# Patient Record
Sex: Male | Born: 1995 | Race: White | Hispanic: No | Marital: Single | Smoking: Never smoker
Health system: Southern US, Community
[De-identification: ages and names within clinical notes are randomized; demographics above are authoritative.]

## PROBLEM LIST (undated history)

## (undated) ENCOUNTER — Emergency Department (HOSPITAL_BASED_OUTPATIENT_CLINIC_OR_DEPARTMENT_OTHER): Admission: EM | Payer: BLUE CROSS/BLUE SHIELD | Source: Home / Self Care

## (undated) DIAGNOSIS — G500 Trigeminal neuralgia: Secondary | ICD-10-CM

## (undated) DIAGNOSIS — R634 Abnormal weight loss: Secondary | ICD-10-CM

## (undated) HISTORY — DX: Abnormal weight loss: R63.4

## (undated) HISTORY — DX: Trigeminal neuralgia: G50.0

## (undated) HISTORY — PX: ADENOIDECTOMY: SUR15

---
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
## 2016-01-01 NOTE — Telephone Encounter (Signed)
[facility], per Pt's chart he has first infusion scheduled 01/21/2016 at [facility].

## 2016-01-01 NOTE — Telephone Encounter (Addendum)
Caller name: Dotzler,Robert Relation to WU:JWJXBJpt:father  Call back number:[phone]   Reason for call:  Father calling back stating [facility] short stay is awaiting to here from the office and father would like a call informing him of status. Please advise

## 2016-01-03 NOTE — Telephone Encounter (Signed)
Spoke w/ Pt's father, Molly MaduroRobert, informed me that he spoke w/ Gerald[facility]rase[facility] at [facility], no further information needed.

## 2016-01-03 NOTE — Telephone Encounter (Signed)
Please follow up with patient father directly he would like to speak with the nurse directly regarding message below. Thank You

## 2016-01-08 DIAGNOSIS — K089 Disorder of teeth and supporting structures, unspecified: Secondary | ICD-10-CM | POA: Diagnosis not present

## 2016-01-08 DIAGNOSIS — G8929 Other chronic pain: Secondary | ICD-10-CM | POA: Diagnosis not present

## 2016-01-08 DIAGNOSIS — M792 Neuralgia and neuritis, unspecified: Secondary | ICD-10-CM | POA: Diagnosis not present

## 2016-01-09 ENCOUNTER — Ambulatory Visit (INDEPENDENT_AMBULATORY_CARE_PROVIDER_SITE_OTHER): Payer: BLUE CROSS/BLUE SHIELD | Admitting: *Deleted

## 2016-01-09 DIAGNOSIS — Z23 Encounter for immunization: Secondary | ICD-10-CM

## 2016-01-09 NOTE — Progress Notes (Signed)
Pre visit review using our clinic review tool, if applicable. No additional management support is needed unless otherwise documented below in the visit note.  Patient tolerated injection well.  Beecher Furio J Maizee Reinhold, RN 

## 2016-01-15 ENCOUNTER — Encounter (HOSPITAL_COMMUNITY)
Admission: RE | Admit: 2016-01-15 | Discharge: 2016-01-15 | Disposition: A | Discharge status: Home / Self Care | Payer: BLUE CROSS/BLUE SHIELD | Source: Ambulatory Visit | Attending: Internal Medicine | Admitting: Internal Medicine

## 2016-01-15 ENCOUNTER — Encounter (HOSPITAL_COMMUNITY): Payer: Self-pay

## 2016-01-15 DIAGNOSIS — G529 Cranial nerve disorder, unspecified: Secondary | ICD-10-CM | POA: Diagnosis not present

## 2016-01-15 DIAGNOSIS — R6884 Jaw pain: Secondary | ICD-10-CM | POA: Insufficient documentation

## 2016-01-15 MED ORDER — SODIUM CHLORIDE 0.9 % IV SOLN
500.0000 mg | Freq: Every day | INTRAVENOUS | Status: DC
  Administered 2016-01-15: 500 mg via INTRAVENOUS
  Filled 2016-01-15 (×2): qty 4

## 2016-01-15 MED ORDER — SODIUM CHLORIDE 0.9 % IV SOLN
Freq: Every day | INTRAVENOUS | Status: DC
  Administered 2016-01-15: 10:00:00 via INTRAVENOUS

## 2016-01-15 NOTE — Progress Notes (Signed)
Uneventful solumedrol infusion.  D/c paperwork on solumedrol given to pt.  Went over next appointments with pt for this week.  Pt d/c ambulatory with family member.

## 2016-01-15 NOTE — Discharge Instructions (Signed)
Methylprednisolone (Injection) (Injectable)   Methylprednisolone (By injection) Methylprednisolone (meth-il-pred-NIS-oh-lone) Treats inflammation, severe allergies, flare-ups of ongoing illnesses, and many other medical problems. May also be used to decrease some symptoms of cancer. This medicine is a corticosteroid (cortisone-like medicine or steroid).  Brand Name(s):Active Injection Kit L, Active Injection Kit LM-DEP, Active Injection Kit LM-Dep-1, Active Injection Kit LM-Dep-2, DEPO-Medrol, DEPO-Medrol Novaplus, Dyural-40, Dyural-80, Dyural-L, Dyural-LM, Medroloan II Suik, [employer], Multi-Specialty Kit, P-Care D40, P-Care D40G There may be other brand names for this medicine.  When This Medicine Should Not Be Used: This medicine is not right for everyone. You should not receive it if you had an allergic reaction to methylprednisolone, or if you have a fungus infection that affects your whole body. You should not have this medicine injected into a muscle if you have idiopathic thrombocytopenic purpura. Some strengths of Solu-Medrol that contain benzyl alcohol should not be used in premature babies. How to Use This Medicine:  A nurse or other trained health professional will give you this medicine. This medicine may be given through a needle placed in one of your veins, as a shot into a muscle or joint, or as a shot into a lesion on your skin.  A nurse or other health provider will give you this medicine.  Your doctor may give you a few doses of this medicine until your condition improves, and then switch you to an oral medicine that works the same way. If you have any concerns about this, talk to your doctor.   Missed dose:   You must use this medicine on a fixed schedule. Call your doctor or pharmacist if you miss a dose. Drugs and Foods to Avoid: Ask your doctor or pharmacist before using any other medicine, including over-the-counter medicines, vitamins, and herbal  products.  Some medicines can affect how this medicine works. Tell your doctor if you are using any of the following: ? Aminoglutethimide, amphotericin B, azithromycin, carbamazepine, cholestyramine, clarithromycin, cyclosporine, digoxin, erythromycin, isoniazid, ketoconazole, phenobarbital, phenytoin, rifampin, or troleandomycin ? Birth control pills ? Blood thinner (including warfarin) ? Diabetes medicine ? Diuretic (water pill) ? NSAID pain or arthritis medicine (including aspirin, celecoxib, diclofenac, ibuprofen, naproxen)  This medicine may interfere with vaccines. Ask your doctor before you get a flu shot or any other vaccines. Warnings While Using This Medicine:  Tell your doctor if you are pregnant or breastfeeding, or if you have kidney disease, liver disease, adrenal gland problems, cataracts, congestive heart failure, diabetes, glaucoma, high blood pressure, mental health problems, stomach or bowel problems (including ulcers, ulcerative colitis), myasthenia gravis, osteoporosis, thyroid problems, or a recent heart attack. Tell your doctor if you have an infection (including herpes eye infection, tuberculosis, or threadworm), or you have recently spent time in a tropical climate.  Solumedrol: Adverse Effects  Common  Cardiovascular: Hypertension  Dermatologic: Atrophic condition of skin, Impaired wound healing  Endocrine metabolic: Body fluid retention, Decreased body growth, Hypernatremia, Hypokalemia  Gastrointestinal: Disorder of gastrointestinal tract, Peptic ulcer disease  Hepatic: Liver function tests abnormal (Mild)  Immunologic: At risk for infection  Musculoskeletal: Muscle weakness  Psychiatric: Depression, Euphoria Serious  Cardiovascular: Congestive heart failure  Endocrine metabolic: Cushing's syndrome, Hyperglycemia, Primary adrenocortical insufficiency  Hepatic: Hepatotoxicity  Musculoskeletal: Osteoporosis  Neurologic: Cerebrovascular accident,  Infarction of spinal cord, Nerve injury, Paraplegia, Raised intracranial pressure, Seizure, Tetraplegia  Ophthalmic: Cataract, Cortical blindness, Glaucoma  Respiratory: Pulmonary tuberculosis

## 2016-01-16 ENCOUNTER — Encounter (HOSPITAL_COMMUNITY)
Admission: RE | Admit: 2016-01-16 | Discharge: 2016-01-16 | Disposition: A | Discharge status: Home / Self Care | Payer: BLUE CROSS/BLUE SHIELD | Source: Ambulatory Visit | Attending: Internal Medicine | Admitting: Internal Medicine

## 2016-01-16 ENCOUNTER — Encounter (HOSPITAL_COMMUNITY): Payer: Self-pay

## 2016-01-16 DIAGNOSIS — R6884 Jaw pain: Secondary | ICD-10-CM | POA: Diagnosis not present

## 2016-01-16 DIAGNOSIS — G529 Cranial nerve disorder, unspecified: Secondary | ICD-10-CM | POA: Diagnosis not present

## 2016-01-16 MED ORDER — SODIUM CHLORIDE 0.9 % IV SOLN
Freq: Every day | INTRAVENOUS | Status: DC
  Administered 2016-01-16: 09:00:00 via INTRAVENOUS

## 2016-01-16 MED ORDER — SODIUM CHLORIDE 0.9 % IV SOLN
500.0000 mg | Freq: Every day | INTRAVENOUS | Status: DC
  Administered 2016-01-16: 500 mg via INTRAVENOUS
  Filled 2016-01-16 (×2): qty 4

## 2016-01-16 NOTE — Discharge Instructions (Signed)
Methylprednisolone Solution for Injection  What is this medicine?  METHYLPREDNISOLONE (meth ill pred NISS oh lone) is a corticosteroid. It is commonly used to treat inflammation of the skin, joints, lungs, and other organs. Common conditions treated include asthma, allergies, and arthritis. It is also used for other conditions, such as blood disorders and diseases of the adrenal glands.  This medicine may be used for other purposes; ask your health care provider or pharmacist if you have questions.  What should I tell my health care provider before I take this medicine?  They need to know if you have any of these conditions:  -cataracts or glaucoma  -Cushing's syndrome  -heart disease  -high blood pressure  -infection including tuberculosis  -low calcium or potassium levels in the blood  -recent surgery  -seizures  -stomach or intestinal disease, including colitis  -threadworms  -thyroid problems  -an unusual or allergic reaction to methylprednisolone, corticosteroids, benzyl alcohol, other medicines, foods, dyes, or preservatives  -pregnant or trying to get pregnant  -breast-feeding  How should I use this medicine?  This medicine is for injection or infusion into a vein. It is also for injection into a muscle. It is given by a health care professional in a hospital or clinic setting.  Talk to your pediatrician regarding the use of this medicine in children. While this drug may be prescribed for selected conditions, precautions do apply.  Overdosage: If you think you have taken too much of this medicine contact a poison control center or emergency room at once.  NOTE: This medicine is only for you. Do not share this medicine with others.  What if I miss a dose?  This does not apply.  What may interact with this medicine?  Do not take this medicine with any of the following medications:  -mifepristone  This medicine may also interact with the following medications:  -aspirin and aspirin-like  medicines  -cyclosporin  -ketoconazole  -phenobarbital  -phenytoin  -rifampin  -tacrolimus  -troleandomycin  -vaccines  -warfarin  This list may not describe all possible interactions. Give your health care provider a list of all the medicines, herbs, non-prescription drugs, or dietary supplements you use. Also tell them if you smoke, drink alcohol, or use illegal drugs. Some items may interact with your medicine.  What should I watch for while using this medicine?  Visit your doctor or health care professional for regular checks on your progress. If you are taking this medicine for a long time, carry an identification card with your name and address, the type and dose of your medicine, and your doctor's name and address.  The medicine may increase your risk of getting an infection. Stay away from people who are sick. Tell your doctor or health care professional if you are around anyone with measles or chickenpox.  You may need to avoid some vaccines. Talk to your health care provider for more information.  If you are going to have surgery, tell your doctor or health care professional that you have taken this medicine within the last twelve months.  Ask your doctor or health care professional about your diet. You may need to lower the amount of salt you eat.  The medicine can increase your blood sugar. If you are a diabetic check with your doctor if you need help adjusting the dose of your diabetic medicine.  What side effects may I notice from receiving this medicine?  Side effects that you should report to your doctor or health care   professional as soon as possible:  -allergic reactions like skin rash, itching or hives, swelling of the face, lips, or tongue  -bloody or tarry stools  -changes in vision  -eye pain or bulging eyes  -fever, sore throat, sneezing, cough, or other signs of infection, wounds that will not heal  -increased thirst  -irregular heartbeat  -muscle cramps  -pain in hips, back, ribs, arms,  shoulders, or legs  -swelling of the ankles, feet, hands  -trouble passing urine or change in the amount of urine  -unusual bleeding or bruising  -unusually weak or tired  -weight gain or weight loss  Side effects that usually do not require medical attention (report to your doctor or health care professional if they continue or are bothersome):  -changes in emotions or moods  -constipation or diarrhea  -headache  -irritation at site where injected  -nausea, vomiting  -skin problems, acne, thin and shiny skin  -trouble sleeping  -unusual hair growth on the face or body  This list may not describe all possible side effects. Call your doctor for medical advice about side effects. You may report side effects to FDA at 1-800-FDA-1088.  Where should I keep my medicine?  This drug is given in a hospital or clinic and will not be stored at home.  NOTE: This sheet is a summary. It may not cover all possible information. If you have questions about this medicine, talk to your doctor, pharmacist, or health care provider.      2016, Elsevier/Gold Standard. (2012-02-11 11:37:16)

## 2016-01-17 ENCOUNTER — Encounter (HOSPITAL_COMMUNITY): Payer: Self-pay

## 2016-01-17 ENCOUNTER — Encounter (HOSPITAL_COMMUNITY)
Admission: RE | Admit: 2016-01-17 | Discharge: 2016-01-17 | Disposition: A | Discharge status: Home / Self Care | Payer: BLUE CROSS/BLUE SHIELD | Source: Ambulatory Visit | Attending: Internal Medicine | Admitting: Internal Medicine

## 2016-01-17 DIAGNOSIS — R6884 Jaw pain: Secondary | ICD-10-CM | POA: Diagnosis not present

## 2016-01-17 DIAGNOSIS — G529 Cranial nerve disorder, unspecified: Secondary | ICD-10-CM | POA: Diagnosis not present

## 2016-01-17 MED ORDER — SODIUM CHLORIDE 0.9 % IV SOLN
500.0000 mg | Freq: Every day | INTRAVENOUS | Status: DC
  Administered 2016-01-17: 500 mg via INTRAVENOUS
  Filled 2016-01-17 (×2): qty 4

## 2016-01-17 MED ORDER — SODIUM CHLORIDE 0.9 % IV SOLN
Freq: Every day | INTRAVENOUS | Status: DC
  Administered 2016-01-17: 08:00:00 via INTRAVENOUS

## 2016-01-17 NOTE — Discharge Instructions (Signed)
Methylprednisolone Solution for Injection  What is this medicine?  METHYLPREDNISOLONE (meth ill pred NISS oh lone) is a corticosteroid. It is commonly used to treat inflammation of the skin, joints, lungs, and other organs. Common conditions treated include asthma, allergies, and arthritis. It is also used for other conditions, such as blood disorders and diseases of the adrenal glands.  This medicine may be used for other purposes; ask your health care provider or pharmacist if you have questions.  What should I tell my health care provider before I take this medicine?  They need to know if you have any of these conditions:  -cataracts or glaucoma  -Cushing's syndrome  -heart disease  -high blood pressure  -infection including tuberculosis  -low calcium or potassium levels in the blood  -recent surgery  -seizures  -stomach or intestinal disease, including colitis  -threadworms  -thyroid problems  -an unusual or allergic reaction to methylprednisolone, corticosteroids, benzyl alcohol, other medicines, foods, dyes, or preservatives  -pregnant or trying to get pregnant  -breast-feeding  How should I use this medicine?  This medicine is for injection or infusion into a vein. It is also for injection into a muscle. It is given by a health care professional in a hospital or clinic setting.  Talk to your pediatrician regarding the use of this medicine in children. While this drug may be prescribed for selected conditions, precautions do apply.  Overdosage: If you think you have taken too much of this medicine contact a poison control center or emergency room at once.  NOTE: This medicine is only for you. Do not share this medicine with others.  What if I miss a dose?  This does not apply.  What may interact with this medicine?  Do not take this medicine with any of the following medications:  -mifepristone  This medicine may also interact with the following medications:  -aspirin and aspirin-like  medicines  -cyclosporin  -ketoconazole  -phenobarbital  -phenytoin  -rifampin  -tacrolimus  -troleandomycin  -vaccines  -warfarin  This list may not describe all possible interactions. Give your health care provider a list of all the medicines, herbs, non-prescription drugs, or dietary supplements you use. Also tell them if you smoke, drink alcohol, or use illegal drugs. Some items may interact with your medicine.  What should I watch for while using this medicine?  Visit your doctor or health care professional for regular checks on your progress. If you are taking this medicine for a long time, carry an identification card with your name and address, the type and dose of your medicine, and your doctor's name and address.  The medicine may increase your risk of getting an infection. Stay away from people who are sick. Tell your doctor or health care professional if you are around anyone with measles or chickenpox.  You may need to avoid some vaccines. Talk to your health care provider for more information.  If you are going to have surgery, tell your doctor or health care professional that you have taken this medicine within the last twelve months.  Ask your doctor or health care professional about your diet. You may need to lower the amount of salt you eat.  The medicine can increase your blood sugar. If you are a diabetic check with your doctor if you need help adjusting the dose of your diabetic medicine.  What side effects may I notice from receiving this medicine?  Side effects that you should report to your doctor or health care   professional as soon as possible:  -allergic reactions like skin rash, itching or hives, swelling of the face, lips, or tongue  -bloody or tarry stools  -changes in vision  -eye pain or bulging eyes  -fever, sore throat, sneezing, cough, or other signs of infection, wounds that will not heal  -increased thirst  -irregular heartbeat  -muscle cramps  -pain in hips, back, ribs, arms,  shoulders, or legs  -swelling of the ankles, feet, hands  -trouble passing urine or change in the amount of urine  -unusual bleeding or bruising  -unusually weak or tired  -weight gain or weight loss  Side effects that usually do not require medical attention (report to your doctor or health care professional if they continue or are bothersome):  -changes in emotions or moods  -constipation or diarrhea  -headache  -irritation at site where injected  -nausea, vomiting  -skin problems, acne, thin and shiny skin  -trouble sleeping  -unusual hair growth on the face or body  This list may not describe all possible side effects. Call your doctor for medical advice about side effects. You may report side effects to FDA at 1-800-FDA-1088.  Where should I keep my medicine?  This drug is given in a hospital or clinic and will not be stored at home.  NOTE: This sheet is a summary. It may not cover all possible information. If you have questions about this medicine, talk to your doctor, pharmacist, or health care provider.      2016, Elsevier/Gold Standard. (2012-02-11 11:37:16)

## 2016-01-18 ENCOUNTER — Encounter (HOSPITAL_COMMUNITY)
Admission: RE | Admit: 2016-01-18 | Discharge: 2016-01-18 | Disposition: A | Discharge status: Home / Self Care | Payer: BLUE CROSS/BLUE SHIELD | Source: Ambulatory Visit | Attending: Internal Medicine | Admitting: Internal Medicine

## 2016-01-18 ENCOUNTER — Encounter (HOSPITAL_COMMUNITY): Payer: Self-pay

## 2016-01-18 DIAGNOSIS — G529 Cranial nerve disorder, unspecified: Secondary | ICD-10-CM | POA: Diagnosis not present

## 2016-01-18 DIAGNOSIS — R6884 Jaw pain: Secondary | ICD-10-CM | POA: Diagnosis not present

## 2016-01-18 MED ORDER — SODIUM CHLORIDE 0.9 % IV SOLN
500.0000 mg | Freq: Every day | INTRAVENOUS | Status: DC
  Administered 2016-01-18: 500 mg via INTRAVENOUS
  Filled 2016-01-18 (×2): qty 4

## 2016-01-18 MED ORDER — SODIUM CHLORIDE 0.9 % IV SOLN
Freq: Every day | INTRAVENOUS | Status: DC
  Administered 2016-01-18: 09:00:00 via INTRAVENOUS

## 2016-01-19 ENCOUNTER — Encounter (HOSPITAL_COMMUNITY): Payer: Self-pay

## 2016-01-19 ENCOUNTER — Encounter (HOSPITAL_COMMUNITY)
Admission: RE | Admit: 2016-01-19 | Discharge: 2016-01-19 | Disposition: A | Discharge status: Home / Self Care | Payer: BLUE CROSS/BLUE SHIELD | Source: Ambulatory Visit | Attending: Internal Medicine | Admitting: Internal Medicine

## 2016-01-19 DIAGNOSIS — G529 Cranial nerve disorder, unspecified: Secondary | ICD-10-CM | POA: Diagnosis not present

## 2016-01-19 DIAGNOSIS — R6884 Jaw pain: Secondary | ICD-10-CM | POA: Diagnosis not present

## 2016-01-19 MED ORDER — METHYLPREDNISOLONE SODIUM SUCC 1000 MG IJ SOLR
500.0000 mg | Freq: Every day | INTRAMUSCULAR | Status: AC
  Administered 2016-01-19: 500 mg via INTRAVENOUS
  Filled 2016-01-19: qty 4

## 2016-01-19 MED ORDER — SODIUM CHLORIDE 0.9 % IV SOLN
Freq: Every day | INTRAVENOUS | Status: AC
  Administered 2016-01-19: 08:00:00 via INTRAVENOUS

## 2016-01-19 NOTE — Progress Notes (Signed)
Pt completed 5 days of daily 500mg  solumedrol IV today.  Pt has tolerated med well.  Pt states he is still having jaw pain, 4/10.  Pt was d/c ambulatory to lobby.

## 2016-02-16 ENCOUNTER — Telehealth: Payer: Self-pay | Admitting: Internal Medicine

## 2016-02-16 NOTE — Telephone Encounter (Signed)
That is okay but we need written instructions from Dr. Clint LippsLiedtke. I reviewed care- anywhere today and I don't see any comments.

## 2016-02-16 NOTE — Telephone Encounter (Signed)
Spoke w/ Pt's father, Molly MaduroRobert, they spoke with Dr[facility] at [facility] today and MD would like for Pt to go through another 5 day cycle of Methylprednisolone 500 mg IV qd x5 days in a row (in 250cc of saline). Pt's father requesting [facility] be [facility] as before. Informed Molly MaduroRobert I would send message to PCP for approval and would call when I have [facility].

## 2016-02-16 NOTE — Telephone Encounter (Signed)
Pt's father called in to speak with CMA. He says that he need to scheduled some infusions for his son.    CB: [phone]

## 2016-02-19 NOTE — Telephone Encounter (Signed)
Spoke w/ Alan Branch, informed him that Alan Branch would like another Rx for methylprednisolone from Alan Branch. Alan Branch said he would contact Alan Branch and have him send Rx.

## 2016-02-19 NOTE — Telephone Encounter (Signed)
Received fax confirmation and copy of order sent for scanning.

## 2016-02-19 NOTE — Telephone Encounter (Signed)
Received message from Dr. Clint LippsLiedtke at [facility]. Confirmed another order for 500mg  methylprednisolone IV infusion in 250mL saline once daily for 5 consecutive days. Order signed by PCP and faxed over to [facility] at [phone].

## 2016-02-21 NOTE — Telephone Encounter (Signed)
Spoke w/ Alan Maduroobert, informed him that we have scheduled Pt for first IV infusion on 03/04/2016 at 0800 [facility]. Informed him to call and speak w/ Alan Branch to get further times for the week (number provided, [phone]). Branch verbalized understanding.

## 2016-03-04 ENCOUNTER — Encounter (HOSPITAL_COMMUNITY)
Admission: RE | Admit: 2016-03-04 | Discharge: 2016-03-04 | Disposition: A | Discharge status: Home / Self Care | Payer: BLUE CROSS/BLUE SHIELD | Source: Ambulatory Visit | Attending: Internal Medicine | Admitting: Internal Medicine

## 2016-03-04 ENCOUNTER — Encounter (HOSPITAL_COMMUNITY): Payer: Self-pay

## 2016-03-04 ENCOUNTER — Other Ambulatory Visit (HOSPITAL_COMMUNITY): Payer: Self-pay | Admitting: Internal Medicine

## 2016-03-04 DIAGNOSIS — G529 Cranial nerve disorder, unspecified: Secondary | ICD-10-CM | POA: Diagnosis not present

## 2016-03-04 DIAGNOSIS — R6884 Jaw pain: Secondary | ICD-10-CM | POA: Insufficient documentation

## 2016-03-04 MED ORDER — SODIUM CHLORIDE 0.9 % IV SOLN
500.0000 mg | Freq: Every day | INTRAVENOUS | Status: DC
  Administered 2016-03-04: 500 mg via INTRAVENOUS
  Filled 2016-03-04 (×2): qty 4

## 2016-03-04 MED ORDER — SODIUM CHLORIDE 0.9 % IV SOLN
Freq: Every day | INTRAVENOUS | Status: DC
  Filled 2016-03-04 (×2): qty 250

## 2016-03-04 MED ORDER — SODIUM CHLORIDE 0.9 % IV SOLN
Freq: Every day | INTRAVENOUS | Status: DC
  Administered 2016-03-04: 08:00:00 via INTRAVENOUS

## 2016-03-04 MED ORDER — SODIUM CHLORIDE 0.9 % IV SOLN
Freq: Every day | INTRAVENOUS | Status: DC
  Filled 2016-03-04 (×3): qty 250

## 2016-03-04 NOTE — Discharge Instructions (Signed)
Methylprednisolone Solution for Injection  What is this medicine?  METHYLPREDNISOLONE (meth ill pred NISS oh lone) is a corticosteroid. It is commonly used to treat inflammation of the skin, joints, lungs, and other organs. Common conditions treated include asthma, allergies, and arthritis. It is also used for other conditions, such as blood disorders and diseases of the adrenal glands.  This medicine may be used for other purposes; ask your health care provider or pharmacist if you have questions.  What should I tell my health care provider before I take this medicine?  They need to know if you have any of these conditions:  -cataracts or glaucoma  -Cushing's syndrome  -heart disease  -high blood pressure  -infection including tuberculosis  -low calcium or potassium levels in the blood  -recent surgery  -seizures  -stomach or intestinal disease, including colitis  -threadworms  -thyroid problems  -an unusual or allergic reaction to methylprednisolone, corticosteroids, benzyl alcohol, other medicines, foods, dyes, or preservatives  -pregnant or trying to get pregnant  -breast-feeding  How should I use this medicine?  This medicine is for injection or infusion into a vein. It is also for injection into a muscle. It is given by a health care professional in a hospital or clinic setting.  Talk to your pediatrician regarding the use of this medicine in children. While this drug may be prescribed for selected conditions, precautions do apply.  Overdosage: If you think you have taken too much of this medicine contact a poison control center or emergency room at once.  NOTE: This medicine is only for you. Do not share this medicine with others.  What if I miss a dose?  This does not apply.  What may interact with this medicine?  Do not take this medicine with any of the following medications:  -mifepristone  This medicine may also interact with the following medications:  -aspirin and aspirin-like  medicines  -cyclosporin  -ketoconazole  -phenobarbital  -phenytoin  -rifampin  -tacrolimus  -troleandomycin  -vaccines  -warfarin  This list may not describe all possible interactions. Give your health care provider a list of all the medicines, herbs, non-prescription drugs, or dietary supplements you use. Also tell them if you smoke, drink alcohol, or use illegal drugs. Some items may interact with your medicine.  What should I watch for while using this medicine?  Visit your doctor or health care professional for regular checks on your progress. If you are taking this medicine for a long time, carry an identification card with your name and address, the type and dose of your medicine, and your doctor's name and address.  The medicine may increase your risk of getting an infection. Stay away from people who are sick. Tell your doctor or health care professional if you are around anyone with measles or chickenpox.  You may need to avoid some vaccines. Talk to your health care provider for more information.  If you are going to have surgery, tell your doctor or health care professional that you have taken this medicine within the last twelve months.  Ask your doctor or health care professional about your diet. You may need to lower the amount of salt you eat.  The medicine can increase your blood sugar. If you are a diabetic check with your doctor if you need help adjusting the dose of your diabetic medicine.  What side effects may I notice from receiving this medicine?  Side effects that you should report to your doctor or health care   professional as soon as possible:  -allergic reactions like skin rash, itching or hives, swelling of the face, lips, or tongue  -bloody or tarry stools  -changes in vision  -eye pain or bulging eyes  -fever, sore throat, sneezing, cough, or other signs of infection, wounds that will not heal  -increased thirst  -irregular heartbeat  -muscle cramps  -pain in hips, back, ribs, arms,  shoulders, or legs  -swelling of the ankles, feet, hands  -trouble passing urine or change in the amount of urine  -unusual bleeding or bruising  -unusually weak or tired  -weight gain or weight loss  Side effects that usually do not require medical attention (report to your doctor or health care professional if they continue or are bothersome):  -changes in emotions or moods  -constipation or diarrhea  -headache  -irritation at site where injected  -nausea, vomiting  -skin problems, acne, thin and shiny skin  -trouble sleeping  -unusual hair growth on the face or body  This list may not describe all possible side effects. Call your doctor for medical advice about side effects. You may report side effects to FDA at 1-800-FDA-1088.  Where should I keep my medicine?  This drug is given in a hospital or clinic and will not be stored at home.  NOTE: This sheet is a summary. It may not cover all possible information. If you have questions about this medicine, talk to your doctor, pharmacist, or health care provider.      2016, Elsevier/Gold Standard. (2012-02-11 11:37:16)

## 2016-03-04 NOTE — Progress Notes (Signed)
Pt denies jaw pain this am.

## 2016-03-05 ENCOUNTER — Encounter (HOSPITAL_COMMUNITY)
Admission: RE | Admit: 2016-03-05 | Discharge: 2016-03-05 | Disposition: A | Discharge status: Home / Self Care | Payer: BLUE CROSS/BLUE SHIELD | Source: Ambulatory Visit | Attending: Internal Medicine | Admitting: Internal Medicine

## 2016-03-05 ENCOUNTER — Encounter (HOSPITAL_COMMUNITY): Payer: Self-pay

## 2016-03-05 DIAGNOSIS — G529 Cranial nerve disorder, unspecified: Secondary | ICD-10-CM | POA: Diagnosis not present

## 2016-03-05 DIAGNOSIS — R6884 Jaw pain: Secondary | ICD-10-CM | POA: Diagnosis not present

## 2016-03-05 MED ORDER — METHYLPREDNISOLONE SODIUM SUCC 1000 MG IJ SOLR
500.0000 mg | Freq: Once | INTRAMUSCULAR | Status: AC
  Administered 2016-03-05: 500 mg via INTRAVENOUS
  Filled 2016-03-05: qty 4

## 2016-03-05 MED ORDER — SODIUM CHLORIDE 0.9 % IV SOLN
INTRAVENOUS | Status: DC
  Administered 2016-03-05: 08:00:00 via INTRAVENOUS

## 2016-03-06 ENCOUNTER — Encounter (HOSPITAL_COMMUNITY): Payer: Self-pay

## 2016-03-06 ENCOUNTER — Encounter (HOSPITAL_COMMUNITY)
Admission: RE | Admit: 2016-03-06 | Discharge: 2016-03-06 | Disposition: A | Discharge status: Home / Self Care | Payer: BLUE CROSS/BLUE SHIELD | Source: Ambulatory Visit | Attending: Internal Medicine | Admitting: Internal Medicine

## 2016-03-06 DIAGNOSIS — R6884 Jaw pain: Secondary | ICD-10-CM | POA: Diagnosis not present

## 2016-03-06 DIAGNOSIS — G529 Cranial nerve disorder, unspecified: Secondary | ICD-10-CM | POA: Diagnosis not present

## 2016-03-06 MED ORDER — SODIUM CHLORIDE 0.9 % IV SOLN
INTRAVENOUS | Status: DC
  Administered 2016-03-06: 09:00:00 via INTRAVENOUS

## 2016-03-06 MED ORDER — SODIUM CHLORIDE 0.9 % IV SOLN
500.0000 mg | Freq: Every day | INTRAVENOUS | Status: DC
  Administered 2016-03-06: 500 mg via INTRAVENOUS
  Filled 2016-03-06 (×2): qty 4

## 2016-03-07 ENCOUNTER — Encounter (HOSPITAL_COMMUNITY)
Admission: RE | Admit: 2016-03-07 | Discharge: 2016-03-07 | Disposition: A | Discharge status: Home / Self Care | Payer: BLUE CROSS/BLUE SHIELD | Source: Ambulatory Visit | Attending: Internal Medicine | Admitting: Internal Medicine

## 2016-03-07 ENCOUNTER — Encounter (HOSPITAL_COMMUNITY): Payer: Self-pay

## 2016-03-07 DIAGNOSIS — R6884 Jaw pain: Secondary | ICD-10-CM | POA: Diagnosis not present

## 2016-03-07 DIAGNOSIS — G529 Cranial nerve disorder, unspecified: Secondary | ICD-10-CM | POA: Diagnosis not present

## 2016-03-07 MED ORDER — SODIUM CHLORIDE 0.9 % IV SOLN
500.0000 mg | Freq: Every day | INTRAVENOUS | Status: DC
  Administered 2016-03-07: 500 mg via INTRAVENOUS
  Filled 2016-03-07 (×2): qty 4

## 2016-03-07 MED ORDER — SODIUM CHLORIDE 0.9 % IV SOLN
INTRAVENOUS | Status: DC
  Administered 2016-03-07: 08:00:00 via INTRAVENOUS

## 2016-03-08 ENCOUNTER — Encounter (HOSPITAL_COMMUNITY)
Admission: RE | Admit: 2016-03-08 | Discharge: 2016-03-08 | Disposition: A | Discharge status: Home / Self Care | Payer: BLUE CROSS/BLUE SHIELD | Source: Ambulatory Visit | Attending: Internal Medicine | Admitting: Internal Medicine

## 2016-03-08 ENCOUNTER — Encounter (HOSPITAL_COMMUNITY): Payer: Self-pay

## 2016-03-08 DIAGNOSIS — R6884 Jaw pain: Secondary | ICD-10-CM | POA: Diagnosis not present

## 2016-03-08 DIAGNOSIS — G529 Cranial nerve disorder, unspecified: Secondary | ICD-10-CM | POA: Diagnosis not present

## 2016-03-08 MED ORDER — SODIUM CHLORIDE 0.9 % IV SOLN
500.0000 mg | Freq: Every day | INTRAVENOUS | Status: AC
  Administered 2016-03-08: 500 mg via INTRAVENOUS
  Filled 2016-03-08: qty 4

## 2016-03-08 MED ORDER — SODIUM CHLORIDE 0.9 % IV SOLN
INTRAVENOUS | Status: DC
  Administered 2016-03-08: 10:00:00 via INTRAVENOUS

## 2016-03-26 DIAGNOSIS — G8929 Other chronic pain: Secondary | ICD-10-CM | POA: Diagnosis not present

## 2016-03-26 DIAGNOSIS — K089 Disorder of teeth and supporting structures, unspecified: Secondary | ICD-10-CM | POA: Diagnosis not present

## 2016-03-26 DIAGNOSIS — R6884 Jaw pain: Secondary | ICD-10-CM | POA: Diagnosis not present

## 2016-03-26 DIAGNOSIS — M792 Neuralgia and neuritis, unspecified: Secondary | ICD-10-CM | POA: Diagnosis not present

## 2016-03-26 DIAGNOSIS — R51 Headache: Secondary | ICD-10-CM | POA: Diagnosis not present

## 2016-05-01 DIAGNOSIS — R6884 Jaw pain: Secondary | ICD-10-CM | POA: Diagnosis not present

## 2016-05-01 DIAGNOSIS — G529 Cranial nerve disorder, unspecified: Secondary | ICD-10-CM | POA: Diagnosis not present

## 2016-06-27 DIAGNOSIS — G529 Cranial nerve disorder, unspecified: Secondary | ICD-10-CM | POA: Diagnosis not present

## 2016-06-27 DIAGNOSIS — R6884 Jaw pain: Secondary | ICD-10-CM | POA: Diagnosis not present

## 2016-06-27 DIAGNOSIS — G43719 Chronic migraine without aura, intractable, without status migrainosus: Secondary | ICD-10-CM | POA: Diagnosis not present

## 2016-07-01 DIAGNOSIS — Z23 Encounter for immunization: Secondary | ICD-10-CM | POA: Diagnosis not present

## 2016-07-01 DIAGNOSIS — S61209A Unspecified open wound of unspecified finger without damage to nail, initial encounter: Secondary | ICD-10-CM | POA: Diagnosis not present

## 2016-07-04 DIAGNOSIS — R6884 Jaw pain: Secondary | ICD-10-CM | POA: Diagnosis not present

## 2016-07-04 DIAGNOSIS — G529 Cranial nerve disorder, unspecified: Secondary | ICD-10-CM | POA: Diagnosis not present

## 2016-07-04 DIAGNOSIS — G43719 Chronic migraine without aura, intractable, without status migrainosus: Secondary | ICD-10-CM | POA: Diagnosis not present

## 2016-07-18 ENCOUNTER — Ambulatory Visit (INDEPENDENT_AMBULATORY_CARE_PROVIDER_SITE_OTHER): Payer: BLUE CROSS/BLUE SHIELD | Admitting: Internal Medicine

## 2016-07-18 ENCOUNTER — Encounter: Payer: Self-pay | Admitting: Internal Medicine

## 2016-07-18 VITALS — BP 118/74 | HR 74 | Temp 98.0°F | Resp 12 | Ht 73.0 in | Wt 150.0 lb

## 2016-07-18 DIAGNOSIS — Z0000 Encounter for general adult medical examination without abnormal findings: Secondary | ICD-10-CM

## 2016-07-18 DIAGNOSIS — G500 Trigeminal neuralgia: Secondary | ICD-10-CM

## 2016-07-18 DIAGNOSIS — Z23 Encounter for immunization: Secondary | ICD-10-CM | POA: Diagnosis not present

## 2016-07-18 LAB — COMPREHENSIVE METABOLIC PANEL
ALBUMIN: 4.4 g/dL (ref 3.5–5.2)
ALK PHOS: 56 U/L (ref 52–171)
ALT: 14 U/L (ref 0–53)
AST: 15 U/L (ref 0–37)
BILIRUBIN TOTAL: 0.5 mg/dL (ref 0.2–1.2)
BUN: 15 mg/dL (ref 6–23)
CO2: 30 mEq/L (ref 19–32)
CREATININE: 0.77 mg/dL (ref 0.40–1.50)
Calcium: 9.1 mg/dL (ref 8.4–10.5)
Chloride: 107 mEq/L (ref 96–112)
GFR: 138 mL/min (ref >60.00)
GLUCOSE: 91 mg/dL (ref 70–99)
Potassium: 4.5 mEq/L (ref 3.5–5.1)
SODIUM: 140 meq/L (ref 135–145)
TOTAL PROTEIN: 6.5 g/dL (ref 6.0–8.3)

## 2016-07-18 NOTE — Progress Notes (Signed)
Pre visit review using our clinic review tool, if applicable. No additional management support is needed unless otherwise documented below in the visit note. 

## 2016-07-18 NOTE — Assessment & Plan Note (Signed)
TN: On multiple medications, current treatment Botox and lidocaine infusions IV. Had 2 of those, they help at least temporarily. To have # 3 lidocaine IV soon. RTC one year

## 2016-07-18 NOTE — Progress Notes (Signed)
Subjective:    Patient ID: Alan Branch, male    DOB: [birth year], 21 y.o.   MRN: [id number]  DOS:  07/18/2016 Type of visit - description : cpx Interval history: No major concerns except trigeminal neuralgia  Wt Readings from Last 3 Encounters:  07/18/16 150 lb (68 kg) (45 %, Z= -0.13)*  03/08/16 156 lb 3.2 oz (70.9 kg) (57 %, Z= 0.17)*  03/07/16 156 lb 6.4 oz (70.9 kg) (57 %, Z= 0.18)*   * Growth percentiles are based on CDC 2-20 Years data.     Review of Systems  Constitutional: No fever. No chills. No unexplained wt changes. No unusual sweats  HEENT:  No eye discharge, no eye  redness , no  intolerance to light   Respiratory: No wheezing , no  difficulty breathing. No cough , no mucus production  Cardiovascular: No CP, no leg swelling , no  Palpitations  GI: no nausea, no vomiting, no diarrhea , no  abdominal pain.  No blood in the stools. No dysphagia, no odynophagia    Endocrine: No polyphagia, no polyuria , no polydipsia  GU: No dysuria, gross hematuria, difficulty urinating. No urinary urgency, no frequency.  Musculoskeletal: No joint swellings or unusual aches or pains  Skin: No change in the color of the skin, palor , no  Rash  Allergic, immunologic: No environmental allergies , no  food allergies  Neurological: No dizziness no  syncope. No headaches. No diplopia, no slurred, no slurred speech, no motor deficits, no facial  Numbness  Hematological: No enlarged lymph nodes, no easy bruising , no unusual bleedings  Psychiatry: No suicidal ideas, no hallucinations, no beavior problems, no confusion.  No unusual/severe anxiety, no depression   Past Medical History:  Diagnosis Date  . Trigeminal neuralgia 2016  . Unintended weight loss 2016    Past Surgical History:  Procedure Laterality Date  . ADENOIDECTOMY  around 2011  . WISDOM TOOTH EXTRACTION  2016    Social History   Social History  . Marital status: Single    Spouse name: N/A  . Number of  children: 0  . Years of education: N/A   Occupational History  . 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 Smoking status: Never Smoker  . Smokeless tobacco: Never Used  . Alcohol use No  . Drug use: No  . Sexual activity: Not on file   Other Topics Concern  . Not on file   Social History Narrative   Household: Father , step mom, 2 brothers (one is his twin)      Family History  Problem Relation Age of Onset  . Diabetes      only GPs  . CAD      only GGPs  . Colon cancer Neg Hx   . Prostate cancer Neg Hx      Allergies as of 07/18/2016   No Known Allergies     Medication List       Accurate as of 07/18/16  7:06 PM. Always use your most recent med list.          acetaminophen 500 MG tablet Commonly known as:  TYLENOL Take 500 mg by mouth every 6 (six) hours as needed.   baclofen 10 MG tablet Commonly known as:  LIORESAL Take 10 mg by mouth 2 (two) times daily. Take 1/2 tablet (5mg ) BID   dronabinol 10 MG capsule Commonly known as:  MARINOL Take 10 mg by  mouth 2 (two) times daily before a meal.   DULoxetine 30 MG capsule Commonly known as:  CYMBALTA Take 30 mg by mouth daily.   nortriptyline 50 MG capsule Commonly known as:  PAMELOR Take 1 capsule by mouth daily.   pregabalin 100 MG capsule Commonly known as:  LYRICA Take 100 mg by mouth 3 (three) times daily.          Objective:   Physical Exam BP 118/74 (BP Location: Left Arm, Patient Position: Sitting, Cuff Size: Normal)   Pulse 74   Temp 98 F (36.7 C) (Oral)   Resp 12   Ht 6\' 1"  (1.854 m)   Wt 150 lb (68 kg)   SpO2 98%   BMI 19.79 kg/m  General:   Well developed, well nourished . NAD.  Neck: No  thyromegaly  HEENT:  Normocephalic . Face symmetric, atraumatic Lungs:  CTA B Normal respiratory effort, no intercostal retractions, no accessory muscle use. Heart: RRR,  no murmur.  No pretibial edema bilaterally  Abdomen:  Not distended, soft,  non-tender. No rebound or rigidity.   Skin: Exposed areas without rash. Not pale. Not jaundice Neurologic:  alert & oriented X3.  Speech normal, gait appropriate for age and unassisted Strength symmetric and appropriate for age.  Psych: Cognition and judgment appear intact.  Cooperative with normal attention span and concentration.  Behavior appropriate. No anxious or depressed appearing.     Assessment & Plan:   Assessment Trigeminal neuralgia after a dental procedure, chronic jaw pain, per [facility] Weight loss d/t TN  PLAN TN: On multiple medications, current treatment Botox and lidocaine infusions IV. Had 2 of those, they help at least temporarily. To have # 3 lidocaine IV soon. RTC one year

## 2016-07-18 NOTE — Patient Instructions (Signed)
Please come back in one month for your immunization BEXSERO #2 Next visit in one year       Safe Sex Safe sex is about reducing the risk of giving or getting a sexually transmitted disease (STD). STDs are spread through sexual contact involving the genitals, mouth, or rectum. Some STDs can be cured and others cannot. Safe sex can also prevent unintended pregnancies.  WHAT ARE SOME SAFE SEX PRACTICES?  Limit your sexual activity to only one partner who is having sex with only you.  Talk to your partner about his or her past partners, past STDs, and drug use.  Use a condom every time you have sexual intercourse. This includes vaginal, oral, and anal sexual activity. Both females and males should wear condoms during oral sex. Only use latex or polyurethane condoms and water-based lubricants. Using petroleum-based lubricants or oils to lubricate a condom will weaken the condom and increase the chance that it will break. The condom should be in place from the beginning to the end of sexual activity. Wearing a condom reduces, but does not completely eliminate, your risk of getting or giving an STD. STDs can be spread by contact with infected body fluids and skin.  Get vaccinated for hepatitis B and HPV.  Avoid alcohol and recreational drugs, which can affect your judgment. You may forget to use a condom or participate in high-risk sex.  For females, avoid douching after sexual intercourse. Douching can spread an infection farther into the reproductive tract.  Check your body for signs of sores, blisters, rashes, or unusual discharge. See your health care provider if you notice any of these signs.  Avoid sexual contact if you have symptoms of an infection or are being treated for an STD. If you or your partner has herpes, avoid sexual contact when blisters are present. Use condoms at all other times.  If you are at risk of being infected with HIV, it is recommended that you take a prescription  medicine daily to prevent HIV infection. This is called pre-exposure prophylaxis (PrEP). You are considered at risk if:  You are a man who has sex with other men (MSM).  You are a heterosexual man or woman who is sexually active with more than one partner.  You take drugs by injection.  You are sexually active with a partner who has HIV.  Talk with your health care provider about whether you are at high risk of being infected with HIV. If you choose to begin PrEP, you should first be tested for HIV. You should then be tested every 3 months for as long as you are taking PrEP.  See your health care provider for regular screenings, exams, and tests for other STDs. Before having sex with a new partner, each of you should be screened for STDs and should talk about the results with each other. WHAT ARE THE BENEFITS OF SAFE SEX?   There is less chance of getting or giving an STD.  You can prevent unwanted or unintended pregnancies.  By discussing safe sex concerns with your partner, you may increase feelings of intimacy, comfort, trust, and honesty between the two of you. This information is not intended to replace advice given to you by your health care provider. Make sure you discuss any questions you have with your health care provider. Document Released: 06/20/2004 Document Revised: 06/03/2014 Document Reviewed: 04/02/2015 Elsevier Interactive Patient Education  2017 Elsevier Inc.   Testicular Self-Exam A self-examination of your testicles involves looking at  and feeling your testicles for abnormal lumps or swelling. Several things can cause swelling, lumps, or pain in your testicles. Some of these causes are:  Injuries.  Inflammation.  Infection.  Accumulation of fluids around your testicle (hydrocele).  Twisted testicles (testicular torsion).  Testicular cancer. Self-examination of the testicles and groin areas may be advised if you are at risk for testicular cancer. Risks for  testicular cancer include:  An undescended testicle (cryptorchidism).  A history of previous testicular cancer.  A family history of testicular cancer. The testicles are easiest to examine after warm baths or showers and are more difficult to examine when you are cold. This is because the muscles attached to the testicles retract and pull them up higher or into the abdomen. Follow these steps while you are standing:  Hold your penis away from your body.  Roll one testicle between your thumb and forefinger, feeling the entire testicle.  Roll the other testicle between your thumb and forefinger, feeling the entire testicle. Feel for lumps, swelling, or discomfort. A normal testicle is egg shaped and feels firm. It is smooth and not tender. The spermatic cord can be felt as a firm spaghetti-like cord at the back of your testicle. It is also important to examine the crease between the front of your leg and your abdomen. Feel for any bumps that are tender. These could be enlarged lymph nodes.  This information is not intended to replace advice given to you by your health care provider. Make sure you discuss any questions you have with your health care provider. Document Released: 08/19/2000 Document Revised: 01/13/2013 Document Reviewed: 11/02/2012 Elsevier Interactive Patient Education  2017 [employer]

## 2016-07-18 NOTE — Assessment & Plan Note (Addendum)
Immunizations reviewed S/p Flu shot . Rec Bexsero #1 today, repeating one month Has a healthy lifestyle  Will also discuss: Safe driving, self testicular exam, safe sex. Also sunscreen use Labs from last year all normal.

## 2016-07-25 DIAGNOSIS — R6884 Jaw pain: Secondary | ICD-10-CM | POA: Diagnosis not present

## 2016-07-25 DIAGNOSIS — G43719 Chronic migraine without aura, intractable, without status migrainosus: Secondary | ICD-10-CM | POA: Diagnosis not present

## 2016-07-25 DIAGNOSIS — G529 Cranial nerve disorder, unspecified: Secondary | ICD-10-CM | POA: Diagnosis not present

## 2016-07-31 DIAGNOSIS — R6884 Jaw pain: Secondary | ICD-10-CM | POA: Diagnosis not present

## 2016-07-31 DIAGNOSIS — M792 Neuralgia and neuritis, unspecified: Secondary | ICD-10-CM | POA: Diagnosis not present

## 2016-07-31 DIAGNOSIS — G43719 Chronic migraine without aura, intractable, without status migrainosus: Secondary | ICD-10-CM | POA: Diagnosis not present

## 2016-08-15 ENCOUNTER — Ambulatory Visit: Payer: BLUE CROSS/BLUE SHIELD

## 2016-08-15 DIAGNOSIS — G43719 Chronic migraine without aura, intractable, without status migrainosus: Secondary | ICD-10-CM | POA: Diagnosis not present

## 2016-08-15 DIAGNOSIS — G529 Cranial nerve disorder, unspecified: Secondary | ICD-10-CM | POA: Diagnosis not present

## 2016-08-15 DIAGNOSIS — R6884 Jaw pain: Secondary | ICD-10-CM | POA: Diagnosis not present

## 2016-09-05 DIAGNOSIS — G43719 Chronic migraine without aura, intractable, without status migrainosus: Secondary | ICD-10-CM | POA: Diagnosis not present

## 2016-09-05 DIAGNOSIS — G529 Cranial nerve disorder, unspecified: Secondary | ICD-10-CM | POA: Diagnosis not present

## 2016-09-05 DIAGNOSIS — R6884 Jaw pain: Secondary | ICD-10-CM | POA: Diagnosis not present

## 2016-09-26 DIAGNOSIS — G529 Cranial nerve disorder, unspecified: Secondary | ICD-10-CM | POA: Diagnosis not present

## 2016-09-26 DIAGNOSIS — R6884 Jaw pain: Secondary | ICD-10-CM | POA: Diagnosis not present

## 2016-09-26 DIAGNOSIS — G43719 Chronic migraine without aura, intractable, without status migrainosus: Secondary | ICD-10-CM | POA: Diagnosis not present

## 2016-10-16 DIAGNOSIS — G529 Cranial nerve disorder, unspecified: Secondary | ICD-10-CM | POA: Diagnosis not present

## 2016-10-16 DIAGNOSIS — R6884 Jaw pain: Secondary | ICD-10-CM | POA: Diagnosis not present

## 2016-10-16 DIAGNOSIS — G43719 Chronic migraine without aura, intractable, without status migrainosus: Secondary | ICD-10-CM | POA: Diagnosis not present

## 2016-10-16 DIAGNOSIS — M792 Neuralgia and neuritis, unspecified: Secondary | ICD-10-CM | POA: Diagnosis not present

## 2016-10-23 DIAGNOSIS — G8929 Other chronic pain: Secondary | ICD-10-CM | POA: Diagnosis not present

## 2016-10-23 DIAGNOSIS — M792 Neuralgia and neuritis, unspecified: Secondary | ICD-10-CM | POA: Diagnosis not present

## 2016-10-23 DIAGNOSIS — S0431XS Injury of trigeminal nerve, right side, sequela: Secondary | ICD-10-CM | POA: Diagnosis not present

## 2016-10-23 DIAGNOSIS — R6884 Jaw pain: Secondary | ICD-10-CM | POA: Diagnosis not present

## 2016-11-11 DIAGNOSIS — G529 Cranial nerve disorder, unspecified: Secondary | ICD-10-CM | POA: Diagnosis not present

## 2016-11-11 DIAGNOSIS — R6884 Jaw pain: Secondary | ICD-10-CM | POA: Diagnosis not present

## 2016-11-11 DIAGNOSIS — M792 Neuralgia and neuritis, unspecified: Secondary | ICD-10-CM | POA: Diagnosis not present

## 2016-11-11 DIAGNOSIS — G43719 Chronic migraine without aura, intractable, without status migrainosus: Secondary | ICD-10-CM | POA: Diagnosis not present

## 2016-11-14 DIAGNOSIS — G4763 Sleep related bruxism: Secondary | ICD-10-CM | POA: Diagnosis not present

## 2016-11-14 DIAGNOSIS — G509 Disorder of trigeminal nerve, unspecified: Secondary | ICD-10-CM | POA: Diagnosis not present

## 2016-11-19 DIAGNOSIS — R634 Abnormal weight loss: Secondary | ICD-10-CM | POA: Diagnosis not present

## 2016-11-19 DIAGNOSIS — G500 Trigeminal neuralgia: Secondary | ICD-10-CM | POA: Diagnosis not present

## 2016-11-19 DIAGNOSIS — R630 Anorexia: Secondary | ICD-10-CM | POA: Diagnosis not present

## 2016-11-19 DIAGNOSIS — K0889 Other specified disorders of teeth and supporting structures: Secondary | ICD-10-CM | POA: Diagnosis not present

## 2016-11-19 DIAGNOSIS — G8928 Other chronic postprocedural pain: Secondary | ICD-10-CM | POA: Diagnosis not present

## 2016-11-19 DIAGNOSIS — S0431XS Injury of trigeminal nerve, right side, sequela: Secondary | ICD-10-CM | POA: Diagnosis not present

## 2016-11-19 DIAGNOSIS — R6884 Jaw pain: Secondary | ICD-10-CM | POA: Diagnosis not present

## 2016-12-12 DIAGNOSIS — G509 Disorder of trigeminal nerve, unspecified: Secondary | ICD-10-CM | POA: Diagnosis not present

## 2016-12-12 DIAGNOSIS — G4763 Sleep related bruxism: Secondary | ICD-10-CM | POA: Diagnosis not present

## 2016-12-18 DIAGNOSIS — G43719 Chronic migraine without aura, intractable, without status migrainosus: Secondary | ICD-10-CM | POA: Diagnosis not present

## 2016-12-18 DIAGNOSIS — M792 Neuralgia and neuritis, unspecified: Secondary | ICD-10-CM | POA: Diagnosis not present

## 2016-12-18 DIAGNOSIS — R6884 Jaw pain: Secondary | ICD-10-CM | POA: Diagnosis not present

## 2016-12-18 DIAGNOSIS — G529 Cranial nerve disorder, unspecified: Secondary | ICD-10-CM | POA: Diagnosis not present

## 2016-12-18 DIAGNOSIS — G8929 Other chronic pain: Secondary | ICD-10-CM | POA: Diagnosis not present

## 2016-12-19 DIAGNOSIS — G529 Cranial nerve disorder, unspecified: Secondary | ICD-10-CM | POA: Diagnosis not present

## 2016-12-19 DIAGNOSIS — R6884 Jaw pain: Secondary | ICD-10-CM | POA: Diagnosis not present

## 2016-12-19 DIAGNOSIS — G43719 Chronic migraine without aura, intractable, without status migrainosus: Secondary | ICD-10-CM | POA: Diagnosis not present

## 2016-12-19 DIAGNOSIS — M792 Neuralgia and neuritis, unspecified: Secondary | ICD-10-CM | POA: Diagnosis not present

## 2016-12-20 DIAGNOSIS — G43719 Chronic migraine without aura, intractable, without status migrainosus: Secondary | ICD-10-CM | POA: Diagnosis not present

## 2016-12-20 DIAGNOSIS — M792 Neuralgia and neuritis, unspecified: Secondary | ICD-10-CM | POA: Diagnosis not present

## 2016-12-20 DIAGNOSIS — R6884 Jaw pain: Secondary | ICD-10-CM | POA: Diagnosis not present

## 2016-12-20 DIAGNOSIS — G529 Cranial nerve disorder, unspecified: Secondary | ICD-10-CM | POA: Diagnosis not present

## 2016-12-23 DIAGNOSIS — R6884 Jaw pain: Secondary | ICD-10-CM | POA: Diagnosis not present

## 2016-12-23 DIAGNOSIS — G43719 Chronic migraine without aura, intractable, without status migrainosus: Secondary | ICD-10-CM | POA: Diagnosis not present

## 2016-12-23 DIAGNOSIS — G8929 Other chronic pain: Secondary | ICD-10-CM | POA: Diagnosis not present

## 2016-12-23 DIAGNOSIS — M792 Neuralgia and neuritis, unspecified: Secondary | ICD-10-CM | POA: Diagnosis not present

## 2016-12-23 DIAGNOSIS — G529 Cranial nerve disorder, unspecified: Secondary | ICD-10-CM | POA: Diagnosis not present

## 2016-12-24 DIAGNOSIS — M792 Neuralgia and neuritis, unspecified: Secondary | ICD-10-CM | POA: Diagnosis not present

## 2016-12-24 DIAGNOSIS — R6884 Jaw pain: Secondary | ICD-10-CM | POA: Diagnosis not present

## 2016-12-24 DIAGNOSIS — G529 Cranial nerve disorder, unspecified: Secondary | ICD-10-CM | POA: Diagnosis not present

## 2016-12-24 DIAGNOSIS — G43719 Chronic migraine without aura, intractable, without status migrainosus: Secondary | ICD-10-CM | POA: Diagnosis not present

## 2016-12-24 DIAGNOSIS — G8929 Other chronic pain: Secondary | ICD-10-CM | POA: Diagnosis not present

## 2017-03-07 ENCOUNTER — Ambulatory Visit (INDEPENDENT_AMBULATORY_CARE_PROVIDER_SITE_OTHER): Payer: BLUE CROSS/BLUE SHIELD | Admitting: Medical

## 2017-03-07 ENCOUNTER — Encounter: Payer: Self-pay | Admitting: Medical

## 2017-03-07 ENCOUNTER — Ambulatory Visit: Payer: BLUE CROSS/BLUE SHIELD | Admitting: Family Medicine

## 2017-03-07 VITALS — BP 113/69 | HR 81 | Temp 98.2°F | Resp 16 | Ht 73.0 in | Wt 146.2 lb

## 2017-03-07 DIAGNOSIS — L739 Follicular disorder, unspecified: Secondary | ICD-10-CM

## 2017-03-07 DIAGNOSIS — L089 Local infection of the skin and subcutaneous tissue, unspecified: Secondary | ICD-10-CM | POA: Diagnosis not present

## 2017-03-07 MED ORDER — DOXYCYCLINE HYCLATE 100 MG PO TABS: 100.0000 mg | ORAL_TABLET | Freq: Two times a day (BID) | ORAL | 0 refills | Status: DC

## 2017-03-07 NOTE — Progress Notes (Signed)
Subjective:    Patient ID: Alan Branch, male    DOB: Feb 21, 1996, 21 y.o.   MRN: [id number]  HPI  Patient has one week of tender area in right groin region and bottom of the scrotum. First 2 days these areas were more tender but now the area is minimally tender. Reports no injury to region or any insect bites. No report of any urinary symptoms or STD type symptoms.  No fevers chills or sweats.    Review of Systems  Constitutional: Negative for chills, fatigue and fever.  Respiratory: Negative for cough, chest tightness, shortness of breath and wheezing.   Cardiovascular: Negative for chest pain and palpitations.  Genitourinary: Negative for difficulty urinating, dysuria and flank pain.  Musculoskeletal: Negative for back pain.  Skin: Negative for rash.       See history of present illness  Hematological: Negative for adenopathy. Does not bruise/bleed easily.       Possible lymph node in the right groin region. Patient expresses his concern.   Past Medical History:  Diagnosis Date  . Trigeminal neuralgia 2016  . Unintended weight loss 2016     Social History   Social History  . Marital status: Single    Spouse name: N/A  . Number of children: 0  . Years of education: N/A   Occupational History  . 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 Smoking status: Never Smoker  . Smokeless tobacco: Never Used  . Alcohol use No  . Drug use: No  . Sexual activity: Not on file   Other Topics Concern  . Not on file   Social History Narrative   Household: Father , step mom, 2 brothers (one is his twin)     Past Surgical History:  Procedure Laterality Date  . ADENOIDECTOMY  around 2011  . WISDOM TOOTH EXTRACTION  2016    Family History  Problem Relation Age of Onset  . Diabetes Unknown        only GPs  . CAD Unknown        only [facility]  . Colon cancer Neg Hx   . Prostate cancer Neg Hx     No Known Allergies  Current Outpatient  Prescriptions on File Prior to Visit  Medication Sig Dispense Refill  . acetaminophen (TYLENOL) 500 MG tablet Take 500 mg by mouth every 6 (six) hours as needed.    . baclofen (LIORESAL) 10 MG tablet Take 10 mg by mouth 2 (two) times daily. Take 1/2 tablet ( ) BID    . dronabinol (MARINOL) 10 MG capsule Take 10 mg by mouth 2 (two) times daily before a meal.    . DULoxetine (CYMBALTA) 30 MG capsule Take 30 mg by mouth daily.  3  . nortriptyline (PAMELOR) 50 MG capsule Take 1 capsule by mouth daily.  1  . pregabalin (LYRICA) 100 MG capsule Take 100 mg by mouth 3 (three) times daily.     No current facility-administered medications on file prior to visit.     BP 113/69   Pulse 81   Temp 98.2 F (36.8 C) (Oral)   Resp 16   Ht  (1.854 m)   Wt 146 lb 3.2 oz (66.3 kg)   SpO2 99%   BMI 19.29 kg/m       Objective:   Physical Exam   General- No acute distress. Pleasant patient. Lungs- Clear, even and unlabored. Heart- regular rate and rhythm. Neurologic-  CNII- XII grossly intact.  Skin- on inspection of the right groin region he has small inflamed area just adjacent to the scrotum. The area feels mildly indurated but is not warm to touch. Faintly tender. No fluctuance. The area is swollen covering area of 1 cm x 1 cm. Also just above his perineum on his scrotum he has similar small region as well. But again no redness or fluctuance. Neither area has breakdown of skin.      Assessment & Plan:  By exam and history, you appear to have 2 areas of folliculitis with mild skin infection. We'll prescribe doxycycline antibiotic. Rx advisement given.  I do want you to follow-up in 7-10 days to make sure the area returns back to normal. If not would consider getting a CBC lab work and possibly refer to urologist. If area changes or worsens be seen sooner.

## 2017-03-07 NOTE — Patient Instructions (Signed)
By exam and history, you appear to have 2 areas of folliculitis with mild skin infection. We'll prescribe doxycycline antibiotic. Rx advisement given.  I do want you to follow-up in 7-10 days to make sure the area returns back to normal. If not would consider getting a CBC lab work and possibly refer to urologist. If area changes or worsens be seen sooner.

## 2017-03-10 ENCOUNTER — Ambulatory Visit: Payer: BLUE CROSS/BLUE SHIELD | Admitting: Internal Medicine

## 2017-03-12 DIAGNOSIS — G508 Other disorders of trigeminal nerve: Secondary | ICD-10-CM | POA: Diagnosis not present

## 2017-03-12 DIAGNOSIS — R6884 Jaw pain: Secondary | ICD-10-CM | POA: Diagnosis not present

## 2017-03-12 DIAGNOSIS — G43719 Chronic migraine without aura, intractable, without status migrainosus: Secondary | ICD-10-CM | POA: Diagnosis not present

## 2017-03-12 DIAGNOSIS — M792 Neuralgia and neuritis, unspecified: Secondary | ICD-10-CM | POA: Diagnosis not present

## 2017-03-19 ENCOUNTER — Ambulatory Visit: Payer: BLUE CROSS/BLUE SHIELD | Admitting: Medical

## 2017-03-19 DIAGNOSIS — Z0289 Encounter for other administrative examinations: Secondary | ICD-10-CM

## 2017-07-09 DIAGNOSIS — G43719 Chronic migraine without aura, intractable, without status migrainosus: Secondary | ICD-10-CM | POA: Diagnosis not present

## 2017-07-22 ENCOUNTER — Encounter: Payer: Self-pay | Admitting: Internal Medicine

## 2017-07-22 ENCOUNTER — Ambulatory Visit (INDEPENDENT_AMBULATORY_CARE_PROVIDER_SITE_OTHER): Payer: BLUE CROSS/BLUE SHIELD | Admitting: Internal Medicine

## 2017-07-22 VITALS — BP 116/78 | HR 68 | Temp 98.2°F | Resp 14 | Ht 73.0 in | Wt 143.1 lb

## 2017-07-22 DIAGNOSIS — Z23 Encounter for immunization: Secondary | ICD-10-CM | POA: Diagnosis not present

## 2017-07-22 DIAGNOSIS — Z0000 Encounter for general adult medical examination without abnormal findings: Secondary | ICD-10-CM

## 2017-07-22 LAB — CBC WITH DIFFERENTIAL/PLATELET
BASOS ABS: 0 10*3/uL (ref 0.0–0.1)
BASOS PCT: 0.4 % (ref 0.0–3.0)
EOS ABS: 0.1 10*3/uL (ref 0.0–0.7)
Eosinophils Relative: 1.8 % (ref 0.0–5.0)
HEMATOCRIT: 46.1 % (ref 39.0–52.0)
HEMOGLOBIN: 15.9 g/dL (ref 13.0–17.0)
LYMPHS PCT: 30.8 % (ref 12.0–46.0)
Lymphs Abs: 1.6 10*3/uL (ref 0.7–4.0)
MCHC: 34.5 g/dL (ref 30.0–36.0)
MCV: 89.4 fl (ref 78.0–100.0)
Monocytes Absolute: 0.4 10*3/uL (ref 0.1–1.0)
Monocytes Relative: 7.8 % (ref 3.0–12.0)
Neutro Abs: 3.1 10*3/uL (ref 1.4–7.7)
Neutrophils Relative %: 59.2 % (ref 43.0–77.0)
Platelets: 277 10*3/uL (ref 150.0–400.0)
RBC: 5.15 Mil/uL (ref 4.22–5.81)
RDW: 13.6 % (ref 11.5–14.6)
WBC: 5.2 10*3/uL (ref 4.5–10.5)

## 2017-07-22 LAB — COMPREHENSIVE METABOLIC PANEL
ALBUMIN: 4.8 g/dL (ref 3.5–5.2)
ALT: 14 U/L (ref 0–53)
AST: 17 U/L (ref 0–37)
Alkaline Phosphatase: 52 U/L (ref 39–117)
BILIRUBIN TOTAL: 0.6 mg/dL (ref 0.2–1.2)
BUN: 15 mg/dL (ref 6–23)
CALCIUM: 10.3 mg/dL (ref 8.4–10.5)
CHLORIDE: 103 meq/L (ref 96–112)
CO2: 30 mEq/L (ref 19–32)
CREATININE: 0.88 mg/dL (ref 0.40–1.50)
GFR: 117.07 mL/min (ref >60.00)
Glucose, Bld: 100 mg/dL — ABNORMAL HIGH (ref 70–99)
Potassium: 4.5 mEq/L (ref 3.5–5.1)
Sodium: 141 mEq/L (ref 135–145)
Total Protein: 7.5 g/dL (ref 6.0–8.3)

## 2017-07-22 LAB — TSH: TSH: 0.71 u[IU]/mL (ref 0.35–5.50)

## 2017-07-22 LAB — FOLATE: FOLATE: 16.8 ng/mL (ref >5.9)

## 2017-07-22 LAB — VITAMIN B12: VITAMIN B 12: 223 pg/mL (ref 211–911)

## 2017-07-22 NOTE — Patient Instructions (Signed)
GO TO THE LAB : Get the blood work     GO TO THE FRONT DESK Schedule your next appointment for a physical exam in 1 year  No driving and texting    Testicular Self-Exam A self-examination of your testicles (testicular self-exam) involves looking at and feeling your testicles for abnormal lumps or swelling. Several things can cause swelling, lumps, or pain in your testicles. Some of these causes are:  Injuries.  Inflammation.  Infection.  Buildup of fluids around your testicle (hydrocele).  Twisted testicles (testicular torsion).  Testicular cancer.  Why is it important to do a testicular self-exam? Self-examination of the testicles and the left and right groin areas may be recommended if you are at risk for testicular cancer. Your groin is where your lower abdomen meets your upper thighs. You may be at risk for testicular cancer if you have:  An undescended testicle (cryptorchidism).  A history of previous testicular cancer.  A family history of testicular cancer.  How to do a testicular self-exam The testicles are easiest to examine after a warm bath or shower. They are more difficult to examine when you are cold. This is because the muscles attached to the testicles retract and pull them up higher or into the abdomen. A normal testicle is egg-shaped and feels firm. It is smooth and not tender. The spermatic cord can be felt as a firm, spaghetti-like cord at the back of your testicle. Look and feel for changes  Stand and hold your penis away from your body.  Look at each testicle to check for lumps or swelling.  Roll each testicle between your thumb and forefinger, feeling the entire testicle. Feel for: ? Lumps. ? Swelling. ? Discomfort.  Check the groin area between your abdomen and upper thighs on both sides of your body. Look and feel for any swelling or bumps that are tender. These could be enlarged lymph nodes. Contact a health care provider if:  You find any  bumps or lumps, such as a small, hard, pea-sized lump.  You find swelling, pain, or soreness.  You see or feel any other changes in your testicles. Summary  A self-examination of your testicles (testicular self-exam) involves looking at and feeling your testicles for any changes.  Self-examination of the testicles and the left and right groin areas may be recommended if you are at risk for testicular cancer.  You should check each of your testicles for lumps, swelling, or discomfort.  You should check for swelling or tender bumps in your groin area between your lower abdomen and upper thighs. This information is not intended to replace advice given to you by your health care provider. Make sure you discuss any questions you have with your health care provider. Document Released: 08/19/2000 Document Revised: 04/08/2016 Document Reviewed: 04/08/2016 Elsevier Interactive Patient Education  2017 [employer]     Safe Sex Practicing safe sex means taking steps before and during sex to reduce your risk of:  Getting an STD (sexually transmitted disease).  Giving your partner an STD.  Unwanted pregnancy.  How can I practice safe sex?  To practice safe sex:  Limit your sexual partners to only one partner who is having sex with only you.  Avoid using alcohol and recreational drugs before having sex. These substances can affect your judgment.  Before having sex with a new partner: ? Talk to your partner about past partners, past STDs, and drug use. ? You and your partner should be screened for  STDs and discuss the results with each other.  Check your body regularly for sores, blisters, rashes, or unusual discharge. If you notice any of these problems, visit your health care provider.  If you have symptoms of an infection or you are being treated for an STD, avoid sexual contact.  While having sex, use a condom. Make sure to: ? Use a condom every time you have vaginal, oral, or  anal sex. Both females and males should wear condoms during oral sex. ? Keep condoms in place from the beginning to the end of sexual activity. ? Use a latex condom, if possible. Latex condoms offer the best protection. ? Use only water-based lubricants or oils to lubricate a condom. Using petroleum-based lubricants or oils will weaken the condom and increase the chance that it will break.  See your health care provider for regular screenings, exams, and tests for STDs.  Talk with your health care provider about the form of birth control (contraception) that is best for you.  Get vaccinated against hepatitis B and human papillomavirus (HPV).  If you are at risk of being infected with HIV (human immunodeficiency virus), talk with your health care provider about taking a prescription medicine to prevent HIV infection. You are considered at risk for HIV if: ? You are a man who has sex with other men. ? You are a heterosexual man or woman who is sexually active with more than one partner. ? You take drugs by injection. ? You are sexually active with a partner who has HIV.  This information is not intended to replace advice given to you by your health care provider. Make sure you discuss any questions you have with your health care provider. Document Released: 06/20/2004 Document Revised: 09/27/2015 Document Reviewed: 04/02/2015 Elsevier Interactive Patient Education  [employer].

## 2017-07-22 NOTE — Progress Notes (Signed)
Pre visit review using our clinic review tool, if applicable. No additional management support is needed unless otherwise documented below in the visit note. 

## 2017-07-22 NOTE — Progress Notes (Signed)
Subjective:    Patient ID: Alan Branch, male    DOB: 08/12/1995, 22 y.o.   MRN: [id number]  DOS:  07/22/2017 Type of visit - description : CPX Interval history: General feeling very well. Medication list reviewed, multiple medications were discontinued  Wt Readings from Last 3 Encounters:  07/22/17 143 lb 2 oz (64.9 kg)  03/07/17 146 lb 3.2 oz (66.3 kg) (35 %, Z= -0.39)*  07/18/16 150 lb (68 kg) (45 %, Z= -0.13)*   * Growth percentiles are based on CDC (Boys, 2-20 Years) data.     Review of Systems Weight is about the same, has lost only 3 pounds. 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 No insomnia. Neuralgia under better control  Other than above, a 14 point review of systems is negative    Past Medical History:  Diagnosis Date  . Trigeminal neuralgia 2016  . Unintended weight loss 2016    Past Surgical History:  Procedure Laterality Date  . ADENOIDECTOMY  around 2011  . WISDOM TOOTH EXTRACTION  2016    Social History   Socioeconomic History  . Marital status: Single    Spouse name: Not on file  . Number of children: 0  . Years of education: Not on file  . Highest education level: Not on file  Social Needs  . Financial resource strain: Not on file  . Food insecurity - worry: Not on file  . Food insecurity - inability: Not on file  . Transportation needs - medical: Not on file  . Transportation needs - non-medical: Not on file  Occupational History  . 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 Smoking status: Never Smoker  . Smokeless tobacco: Never Used  Substance and Sexual Activity  . Alcohol use: No  . Drug use: No  . Sexual activity: Not on file  Other Topics Concern  . Not on file  Social History Narrative   Household: Father , step mom, 2 brothers (one is his twin)      Family History  Problem Relation Age of Onset  . Diabetes Unknown         only GPs  . CAD Unknown        only [facility]  . Colon cancer Neg Hx   . Prostate cancer Neg Hx      Allergies as of 07/22/2017   No Known Allergies     Medication List        Accurate as of 07/22/17 11:59 PM. Always use your most recent med list.          AIMOVIG 140 DOSE 70 MG/ML Soaj Generic drug:  Erenumab-aooe Inject into the skin. As directed          Objective:   Physical Exam BP 116/78 (BP Location: Left Arm, Patient Position: Sitting, Cuff Size: Small)   Pulse 68   Temp 98.2 F (36.8 C) (Oral)   Resp 14   Ht 6\' 1"  (1.854 m)   Wt 143 lb 2 oz (64.9 kg)   SpO2 95%   BMI 18.88 kg/m  General:   Well developed, despite slightly low BMI he looks healthy, in no distress Neck: No  thyromegaly  HEENT:  Normocephalic . Face symmetric, atraumatic Lungs:  CTA B Normal respiratory effort, no intercostal retractions, no accessory muscle use. Heart: RRR,  no murmur.  No pretibial edema bilaterally  Abdomen:  Not distended, soft, non-tender. No rebound or rigidity.   Skin: Exposed areas without rash. Not pale. Not jaundice Neurologic:  alert & oriented X3.  Speech normal, gait appropriate for age and unassisted Strength symmetric and appropriate for age.  Psych: Cognition and judgment appear intact.  Cooperative with normal attention span and concentration.  Behavior appropriate. No anxious or depressed appearing.     Assessment & Plan:   Assessment Trigeminal neuralgia after a dental procedure, chronic jaw pain, per [facility] Weight loss d/t TN  PLAN  T.N.: Multiple medications were d/c, overall feeling better on current med. Underweight: Slightly underweight by BMI, he however feels well physically and emotionally ; TN pain is under better control. Plan:  In addition to routine labs will check his vitamins; rx a liberal but healthy diet and stay active. RTC 1 year

## 2017-07-22 NOTE — Assessment & Plan Note (Signed)
-  Immunizations reviewed Rec Bexsero #2 today. Completed Menactra. - Counselor at about lifestyle, safe sex, safe driving, self testicular exam. -Labs: CMP, CBC, TSH, vitamin D, B12, folic acid

## 2017-07-23 NOTE — Assessment & Plan Note (Signed)
T.N.: Multiple medications were d/c, overall feeling better on current med. Underweight: Slightly underweight by BMI, he however feels well physically and emotionally ; TN pain is under better control. Plan:  In addition to routine labs will check his vitamins; rx a liberal but healthy diet and stay active. RTC 1 year

## 2017-07-25 LAB — VITAMIN D 1,25 DIHYDROXY
VITAMIN D 1, 25 (OH) TOTAL: 26 pg/mL (ref 18–72)
VITAMIN D3 1, 25 (OH): 26 pg/mL

## 2017-11-19 DIAGNOSIS — G43719 Chronic migraine without aura, intractable, without status migrainosus: Secondary | ICD-10-CM | POA: Diagnosis not present

## 2017-11-19 DIAGNOSIS — G508 Other disorders of trigeminal nerve: Secondary | ICD-10-CM | POA: Diagnosis not present

## 2018-02-10 ENCOUNTER — Ambulatory Visit: Payer: BLUE CROSS/BLUE SHIELD | Admitting: Internal Medicine

## 2018-02-10 ENCOUNTER — Encounter: Payer: Self-pay | Admitting: Internal Medicine

## 2018-02-10 VITALS — BP 124/76 | HR 81 | Temp 98.1°F | Resp 16 | Ht 73.0 in | Wt 148.1 lb

## 2018-02-10 DIAGNOSIS — J029 Acute pharyngitis, unspecified: Secondary | ICD-10-CM | POA: Diagnosis not present

## 2018-02-10 DIAGNOSIS — J019 Acute sinusitis, unspecified: Secondary | ICD-10-CM | POA: Diagnosis not present

## 2018-02-10 LAB — POCT RAPID STREP A (OFFICE): Rapid Strep A Screen: NEGATIVE

## 2018-02-10 MED ORDER — AMOXICILLIN 500 MG PO CAPS: 1000.0000 mg | ORAL_CAPSULE | Freq: Two times a day (BID) | ORAL | 0 refills | Status: DC

## 2018-02-10 NOTE — Patient Instructions (Signed)
Rest, fluids , tylenol  For cough:  Take Mucinex DM twice a day as needed until better  For nasal congestion: Use OTC Nasocort or Flonase : 2 nasal sprays on each side of the nose in the morning until you feel better   Take the antibiotic as prescribed  (Amoxicillin) if no better in 4-5 days   Call if not gradually better over the next  10 days  Call anytime if the symptoms are severe

## 2018-02-10 NOTE — Progress Notes (Signed)
Pre visit review using our clinic review tool, if applicable. No additional management support is needed unless otherwise documented below in the visit note. 

## 2018-02-10 NOTE — Progress Notes (Signed)
Subjective:    Patient ID: Alan Branch, male    DOB: [birth year], 22 y.o.   MRN: [id number]  DOS:  02/10/2018 Type of visit - description : acute Interval history: Sx started 3 days ago, reports cough but mostly sinus pressure and congestion at the maxillary area. Some sore throat. Mucinex DayQuil not helping much.   Review of Systems Denies fever chills No nausea, vomiting, myalgias. No problems with chest congestion or sputum production with cough.  Past Medical History:  Diagnosis Date  . Trigeminal neuralgia 2016  . Unintended weight loss 2016    Past Surgical History:  Procedure Laterality Date  . ADENOIDECTOMY  around 2011  . WISDOM TOOTH EXTRACTION  2016    Social History   Socioeconomic History  . Marital status: Single    Spouse name: Not on file  . Number of children: 0  . Years of education: Not on file  . Highest education level: Not on file  Occupational History  . 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 Financial resource strain: Not on file  . Food insecurity:    Worry: Not on file    Inability: Not on file  . Transportation needs:    Medical: Not on file    Non-medical: Not on file  Tobacco Use  . Smoking status: Never Smoker  . Smokeless tobacco: Never Used  Substance and Sexual Activity  . Alcohol use: No  . Drug use: No  . Sexual activity: Not on file  Lifestyle  . Physical activity:    Days per week: Not on file    Minutes per session: Not on file  . Stress: Not on file  Relationships  . Social connections:    Talks on phone: Not on file    Gets together: Not on file    Attends religious service: Not on file    Active member of club or organization: Not on file    Attends meetings of clubs or organizations: Not on file    Relationship status: Not on file  . Intimate partner violence:    Fear of current or ex partner: Not on file    Emotionally abused: Not on file    Physically abused: Not on file   Forced sexual activity: Not on file  Other Topics Concern  . Not on file  Social History Narrative   Household: Father , step mom, 2 brothers (one is his twin)       Allergies as of 02/10/2018   No Known Allergies     Medication List        Accurate as of 02/10/18 11:59 PM. Always use your most recent med list.          amoxicillin 500 MG capsule Commonly known as:  AMOXIL Take 2 capsules (1,000 mg total) by mouth 2 (two) times daily.          Objective:   Physical Exam BP 124/76 (BP Location: Left Arm, Patient Position: Sitting, Cuff Size: Small)   Pulse 81   Temp 98.1 F (36.7 C) (Oral)   Resp 16   Ht 6\' 1"  (1.854 m)   Wt 148 lb 2 oz (67.2 kg)   SpO2 98%   BMI 19.54 kg/m  General:   Well developed, NAD, see BMI.  HEENT:  Normocephalic . Face symmetric, atraumatic.  TMs slightly bulging bilaterally but not red.  Nose quite congested.  Maxillary sinuses no TTP.  Throat symmetric,  mildly red, no discharge. Neck: Several small lymph nodes on both sides, nontender. Lungs:  Clear, few rhonchi with cough only.  No wheezing. Normal respiratory effort, no intercostal retractions, no accessory muscle use. Heart: RRR,  no murmur.  No pretibial edema bilaterally  Skin: Not pale. Not jaundice Neurologic:  alert & oriented X3.  Speech normal, gait appropriate for age and unassisted Psych--  Cognition and judgment appear intact.  Cooperative with normal attention span and concentration.  Behavior appropriate. No anxious or depressed appearing.      Assessment & Plan:   Assessment Trigeminal neuralgia after a dental procedure, chronic jaw pain, per [facility] Weight loss d/t TN  PLAN  Sinusitis: Strep test negative, likely viral sinusitis.  He is quite congested at the sinuses though.  Recommend supportive treatment with Tylenol, Mucinex, Flonase.  If he is not gradually improving will use amoxicillin.  See instructions. History of trigeminal neuralgia: Currently  on no medication, [facility]  Symptoms are currently mild.

## 2018-02-11 NOTE — Assessment & Plan Note (Signed)
Sinusitis: Strep test negative, likely viral sinusitis.  He is quite congested at the sinuses though.  Recommend supportive treatment with Tylenol, Mucinex, Flonase.  If he is not gradually improving will use amoxicillin.  See instructions. History of trigeminal neuralgia: Currently on no medication, [facility]  Symptoms are currently mild.

## 2018-03-04 DIAGNOSIS — G4763 Sleep related bruxism: Secondary | ICD-10-CM | POA: Diagnosis not present

## 2018-03-04 DIAGNOSIS — G508 Other disorders of trigeminal nerve: Secondary | ICD-10-CM | POA: Diagnosis not present

## 2018-03-04 DIAGNOSIS — R6884 Jaw pain: Secondary | ICD-10-CM | POA: Diagnosis not present

## 2018-03-04 DIAGNOSIS — M792 Neuralgia and neuritis, unspecified: Secondary | ICD-10-CM | POA: Diagnosis not present

## 2018-05-06 DIAGNOSIS — R6884 Jaw pain: Secondary | ICD-10-CM | POA: Diagnosis not present

## 2018-05-06 DIAGNOSIS — S0431XA Injury of trigeminal nerve, right side, initial encounter: Secondary | ICD-10-CM | POA: Diagnosis not present

## 2018-05-06 DIAGNOSIS — G500 Trigeminal neuralgia: Secondary | ICD-10-CM | POA: Diagnosis not present

## 2018-05-06 DIAGNOSIS — G8929 Other chronic pain: Secondary | ICD-10-CM | POA: Diagnosis not present

## 2018-05-11 DIAGNOSIS — G508 Other disorders of trigeminal nerve: Secondary | ICD-10-CM | POA: Diagnosis not present

## 2018-05-11 DIAGNOSIS — G43719 Chronic migraine without aura, intractable, without status migrainosus: Secondary | ICD-10-CM | POA: Diagnosis not present

## 2018-05-11 DIAGNOSIS — R6884 Jaw pain: Secondary | ICD-10-CM | POA: Diagnosis not present

## 2018-05-11 DIAGNOSIS — G500 Trigeminal neuralgia: Secondary | ICD-10-CM | POA: Diagnosis not present

## 2018-05-11 DIAGNOSIS — G8929 Other chronic pain: Secondary | ICD-10-CM | POA: Diagnosis not present

## 2018-05-12 DIAGNOSIS — M792 Neuralgia and neuritis, unspecified: Secondary | ICD-10-CM | POA: Diagnosis not present

## 2018-05-12 DIAGNOSIS — G43719 Chronic migraine without aura, intractable, without status migrainosus: Secondary | ICD-10-CM | POA: Diagnosis not present

## 2018-05-12 DIAGNOSIS — G508 Other disorders of trigeminal nerve: Secondary | ICD-10-CM | POA: Diagnosis not present

## 2018-05-12 DIAGNOSIS — G529 Cranial nerve disorder, unspecified: Secondary | ICD-10-CM | POA: Diagnosis not present

## 2018-05-12 DIAGNOSIS — R6884 Jaw pain: Secondary | ICD-10-CM | POA: Diagnosis not present

## 2018-05-13 DIAGNOSIS — G43719 Chronic migraine without aura, intractable, without status migrainosus: Secondary | ICD-10-CM | POA: Diagnosis not present

## 2018-05-13 DIAGNOSIS — G529 Cranial nerve disorder, unspecified: Secondary | ICD-10-CM | POA: Diagnosis not present

## 2018-05-13 DIAGNOSIS — R6884 Jaw pain: Secondary | ICD-10-CM | POA: Diagnosis not present

## 2018-05-13 DIAGNOSIS — G508 Other disorders of trigeminal nerve: Secondary | ICD-10-CM | POA: Diagnosis not present

## 2018-05-13 DIAGNOSIS — M792 Neuralgia and neuritis, unspecified: Secondary | ICD-10-CM | POA: Diagnosis not present

## 2018-05-14 DIAGNOSIS — G508 Other disorders of trigeminal nerve: Secondary | ICD-10-CM | POA: Diagnosis not present

## 2018-05-14 DIAGNOSIS — M792 Neuralgia and neuritis, unspecified: Secondary | ICD-10-CM | POA: Diagnosis not present

## 2018-05-14 DIAGNOSIS — G529 Cranial nerve disorder, unspecified: Secondary | ICD-10-CM | POA: Diagnosis not present

## 2018-05-14 DIAGNOSIS — R6884 Jaw pain: Secondary | ICD-10-CM | POA: Diagnosis not present

## 2018-05-14 DIAGNOSIS — G43719 Chronic migraine without aura, intractable, without status migrainosus: Secondary | ICD-10-CM | POA: Diagnosis not present

## 2018-05-15 DIAGNOSIS — M792 Neuralgia and neuritis, unspecified: Secondary | ICD-10-CM | POA: Diagnosis not present

## 2018-05-15 DIAGNOSIS — G43719 Chronic migraine without aura, intractable, without status migrainosus: Secondary | ICD-10-CM | POA: Diagnosis not present

## 2018-05-15 DIAGNOSIS — R6884 Jaw pain: Secondary | ICD-10-CM | POA: Diagnosis not present

## 2018-05-15 DIAGNOSIS — G529 Cranial nerve disorder, unspecified: Secondary | ICD-10-CM | POA: Diagnosis not present

## 2018-05-15 DIAGNOSIS — G508 Other disorders of trigeminal nerve: Secondary | ICD-10-CM | POA: Diagnosis not present

## 2018-07-23 ENCOUNTER — Ambulatory Visit (INDEPENDENT_AMBULATORY_CARE_PROVIDER_SITE_OTHER): Payer: BLUE CROSS/BLUE SHIELD | Admitting: Internal Medicine

## 2018-07-23 ENCOUNTER — Encounter: Payer: Self-pay | Admitting: Internal Medicine

## 2018-07-23 VITALS — BP 108/62 | HR 89 | Temp 97.9°F | Resp 16 | Ht 73.0 in | Wt 148.5 lb

## 2018-07-23 DIAGNOSIS — Z23 Encounter for immunization: Secondary | ICD-10-CM

## 2018-07-23 DIAGNOSIS — Z0000 Encounter for general adult medical examination without abnormal findings: Secondary | ICD-10-CM

## 2018-07-23 LAB — CBC WITH DIFFERENTIAL/PLATELET
BASOS ABS: 0 10*3/uL (ref 0.0–0.1)
Basophils Relative: 0.4 % (ref 0.0–3.0)
EOS ABS: 0.2 10*3/uL (ref 0.0–0.7)
Eosinophils Relative: 3.2 % (ref 0.0–5.0)
HCT: 48 % (ref 39.0–52.0)
Hemoglobin: 16.5 g/dL (ref 13.0–17.0)
LYMPHS PCT: 36.6 % (ref 12.0–46.0)
Lymphs Abs: 2.5 10*3/uL (ref 0.7–4.0)
MCHC: 34.5 g/dL (ref 30.0–36.0)
MCV: 90.5 fl (ref 78.0–100.0)
MONOS PCT: 7 % (ref 3.0–12.0)
Monocytes Absolute: 0.5 10*3/uL (ref 0.1–1.0)
NEUTROS ABS: 3.7 10*3/uL (ref 1.4–7.7)
Neutrophils Relative %: 52.8 % (ref 43.0–77.0)
PLATELETS: 229 10*3/uL (ref 150.0–400.0)
RBC: 5.3 Mil/uL (ref 4.22–5.81)
RDW: 13.3 % (ref 11.5–15.5)
WBC: 6.9 10*3/uL (ref 4.0–10.5)

## 2018-07-23 LAB — LIPID PANEL
CHOL/HDL RATIO: 3
CHOLESTEROL: 142 mg/dL (ref 0–200)
HDL: 41 mg/dL (ref >39.00)
LDL Cholesterol: 86 mg/dL (ref 0–99)
NonHDL: 100.99
Triglycerides: 77 mg/dL (ref 0.0–149.0)
VLDL: 15.4 mg/dL (ref 0.0–40.0)

## 2018-07-23 NOTE — Patient Instructions (Signed)
GO TO THE LAB : Get the blood work     GO TO THE FRONT DESK Schedule your next appointment   for a physical exam in 1 year 

## 2018-07-23 NOTE — Progress Notes (Signed)
Subjective:    Patient ID: Alan Branch, male    DOB: 01/14/1996, 23 y.o.   MRN: [id number]  DOS:  07/23/2018 Type of visit - description: Complete physical exam In general doing well.  Wt Readings from Last 3 Encounters:  07/23/18 148 lb 8 oz (67.4 kg)  02/10/18 148 lb 2 oz (67.2 kg)  07/22/17 143 lb 2 oz (64.9 kg)     Review of Systems Still has some issues with trigeminal neuralgia, occasionally difficult his sleep  Other than above, a 14 point review of systems is negative     Past Medical History:  Diagnosis Date  . Trigeminal neuralgia 2016  . Unintended weight loss 2016    Past Surgical History:  Procedure Laterality Date  . ADENOIDECTOMY  around 2011  . WISDOM TOOTH EXTRACTION  2016    Social History   Socioeconomic History  . Marital status: Single    Spouse name: Not on file  . Number of children: 0  . Years of education: Not on file  . Highest education level: Not on file  Occupational History  . 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 Financial resource strain: Not on file  . Food insecurity:    Worry: Not on file    Inability: Not on file  . Transportation needs:    Medical: Not on file    Non-medical: Not on file  Tobacco Use  . Smoking status: Never Smoker  . Smokeless tobacco: Never Used  Substance and Sexual Activity  . Alcohol use: No  . Drug use: No  . Sexual activity: Not on file  Lifestyle  . Physical activity:    Days per week: Not on file    Minutes per session: Not on file  . Stress: Not on file  Relationships  . Social connections:    Talks on phone: Not on file    Gets together: Not on file    Attends religious service: Not on file    Active member of club or organization: Not on file    Attends meetings of clubs or organizations: Not on file    Relationship status: Not on file  . Intimate partner violence:    Fear of current or ex partner: Not on file    Emotionally abused: Not on file   Physically abused: Not on file    Forced sexual activity: Not on file  Other Topics Concern  . Not on file  Social History Narrative   Household: Father , step mom      Family History  Problem Relation Age of Onset  . Diabetes Other        only GPs  . CAD Other        only [facility]  . Colon cancer Neg Hx   . Prostate cancer Neg Hx      Allergies as of 07/23/2018   No Known Allergies     Medication List    as of July 23, 2018  5:35 PM   You have not been prescribed any medications.         Objective:   Physical Exam BP 108/62 (BP Location: Left Arm, Patient Position: Sitting, Cuff Size: Small)   Pulse 89   Temp 97.9 F (36.6 C) (Oral)   Resp 16   Ht 6\' 1"  (1.854 m)   Wt 148 lb 8 oz (67.4 kg)   SpO2 98%   BMI 19.59 kg/m  General: Well  developed, NAD, BMI noted Neck: No  thyromegaly  HEENT:  Normocephalic . Face symmetric, atraumatic Lungs:  CTA B Normal respiratory effort, no intercostal retractions, no accessory muscle use. Heart: RRR,  no murmur.  No pretibial edema bilaterally  Abdomen:  Not distended, soft, non-tender. No rebound or rigidity.   Skin: Exposed areas without rash. Not pale. Not jaundice Neurologic:  alert & oriented X3.  Speech normal, gait appropriate for age and unassisted Strength symmetric and appropriate for age.  Psych: Cognition and judgment appear intact.  Cooperative with normal attention span and concentration.  Behavior appropriate. No anxious or depressed appearing.      Assessment      Assessment Trigeminal neuralgia after a dental procedure, chronic jaw pain, per [facility] Weight loss d/t TN  PLAN Trigeminal neuralgia: Overall improved, still having some symptoms.  Had a hyperbaric chamber treatment recently, it did not help as much as expected. 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

## 2018-07-23 NOTE — Assessment & Plan Note (Signed)
Trigeminal neuralgia: Overall improved, still having some symptoms.  Had a hyperbaric chamber treatment recently, it did not help as much as expected. 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

## 2018-07-23 NOTE — Progress Notes (Signed)
Pre visit review using our clinic review tool, if applicable. No additional management support is needed unless otherwise documented below in the visit note. 

## 2018-07-23 NOTE — Assessment & Plan Note (Addendum)
Immunizations reviewed: Check hepatitis A serology. In general she is doing well. Has a healthy lifestyle Labs: BMP, CBC, FLP, hepatitis A serology.

## 2018-07-24 LAB — HEPATITIS A ANTIBODY, TOTAL: Hepatitis A AB,Total: NONREACTIVE

## 2018-08-19 ENCOUNTER — Telehealth (INDEPENDENT_AMBULATORY_CARE_PROVIDER_SITE_OTHER): Payer: BLUE CROSS/BLUE SHIELD | Admitting: Internal Medicine

## 2018-08-19 ENCOUNTER — Other Ambulatory Visit: Payer: Self-pay

## 2018-08-19 ENCOUNTER — Telehealth: Payer: Self-pay | Admitting: Internal Medicine

## 2018-08-19 DIAGNOSIS — J0100 Acute maxillary sinusitis, unspecified: Secondary | ICD-10-CM

## 2018-08-19 DIAGNOSIS — Z09 Encounter for follow-up examination after completed treatment for conditions other than malignant neoplasm: Secondary | ICD-10-CM

## 2018-08-19 MED ORDER — AZELASTINE HCL 0.1 % NA SOLN: 2.0000 | Freq: Every evening | NASAL | 3 refills | Status: DC | PRN

## 2018-08-19 MED ORDER — AZITHROMYCIN 250 MG PO TABS: ORAL_TABLET | ORAL | 0 refills | Status: DC

## 2018-08-19 NOTE — Progress Notes (Signed)
Virtual Visit via Telephone Note  I connected with Alan Branch on 08/19/18 at  3:00 PM EDT by telephone and verified that I am speaking with the correct person using two identifiers.   I discussed the limitations, risks, security and privacy concerns of performing an evaluation and management service by telephone and the availability of in person appointments. I also discussed with the patient that there may be a patient responsible charge related to this service. The patient expressed understanding and agreed to proceed.   History of Present Illness: Patient request advise, thinks he has sinusitis Symptoms started 3 to 4 weeks ago with mild sinus pressure, pressure is now increasing to the point that the sinuses are painful. He has postnasal dripping, he is also blowing greenish nasal discharge. Has taking Benadryl and Mucinex.  ROS No fever chills No itchy eyes Has some cough and chest congestion but that seems to be better, now symptoms are concentrated and the sinus area.   Observations/Objective: He sounded well over the phone  Assessment and Plan:  Assessment Trigeminal neuralgia after a dental procedure, chronic jaw pain, per [facility] Weight loss d/t [place]  PLAN Telephone visit Sinusitis: Symptoms consistent with sinusitis, recommend OTC Flonase, prescription for Astelin and Zithromax sent to his pharmacy. He is to let me know if he is not improving.  Follow Up Instructions: As above   I discussed the assessment and treatment plan with the patient. The patient was provided an opportunity to ask questions and all were answered. The patient agreed with the plan and demonstrated an understanding of the instructions.   The patient was advised to call back or seek an in-person evaluation if the symptoms worsen or if the condition fails to improve as anticipated.  I provided 12 minutes of non-face-to-face time during this encounter.   Willow Ora, MD

## 2018-08-19 NOTE — Telephone Encounter (Signed)
Spoke w/ Pt-telephone visit at 3.

## 2018-08-19 NOTE — Telephone Encounter (Signed)
Copied from CRM [id number]. Topic: Quick Communication - See Telephone Encounter >> Aug 19, 2018  9:12 AM Baldo Daub L wrote: CRM for notification. See Telephone encounter for: 08/19/18.  Pt called and left voice mail on [facility] mailbox stating that he had called two days ago about his sinus infection but still hasn't heard from anyone. Pt would like a call back at [phone].

## 2018-08-19 NOTE — Telephone Encounter (Signed)
LMOM informing Pt that we are doing virtual visits. Asked for him to return call if interested.

## 2018-08-20 DIAGNOSIS — J329 Chronic sinusitis, unspecified: Secondary | ICD-10-CM | POA: Insufficient documentation

## 2018-08-20 NOTE — Assessment & Plan Note (Signed)
Telephone visit Sinusitis: Symptoms consistent with sinusitis, recommend OTC Flonase, prescription for Astelin and Zithromax sent to his pharmacy. He is to let me know if he is not improving.  Follow Up Instructions: As above

## 2018-08-26 ENCOUNTER — Telehealth: Payer: Self-pay | Admitting: Internal Medicine

## 2018-08-26 NOTE — Telephone Encounter (Signed)
Copied from CRM [id number]. Topic: General - Other >> Aug 26, 2018  1:16 PM Leafy Ro wrote: Reason for CRM:pt had telephone call with dr  Drue Novel on 08-19-2018 and was dx with sinusitis. Pt symptoms has return pressure in nose. Pt would like another round abx. [facility]

## 2018-08-26 NOTE — Telephone Encounter (Signed)
Please advise 

## 2018-08-27 NOTE — Telephone Encounter (Signed)
Advise patient: Based on the information, no more antibiotics are needed. Recommend good compliance with Astelin, Flonase.  Also take Mucinex as needed. Additionally, he can take OTC pseudoephedrine 30 mg 1 tablet every 4 hours as needed for congestion.

## 2018-08-27 NOTE — Telephone Encounter (Signed)
Spoke w/ Pt- informed of recommendations. Pt verbalized understanding.  

## 2018-09-02 DIAGNOSIS — M62838 Other muscle spasm: Secondary | ICD-10-CM | POA: Diagnosis not present

## 2018-10-07 DIAGNOSIS — G43019 Migraine without aura, intractable, without status migrainosus: Secondary | ICD-10-CM | POA: Insufficient documentation

## 2019-02-17 DIAGNOSIS — M792 Neuralgia and neuritis, unspecified: Secondary | ICD-10-CM | POA: Diagnosis not present

## 2019-03-28 DIAGNOSIS — M792 Neuralgia and neuritis, unspecified: Secondary | ICD-10-CM | POA: Diagnosis not present

## 2019-03-28 DIAGNOSIS — G508 Other disorders of trigeminal nerve: Secondary | ICD-10-CM | POA: Diagnosis not present

## 2019-03-28 DIAGNOSIS — R6884 Jaw pain: Secondary | ICD-10-CM | POA: Diagnosis not present

## 2019-03-28 DIAGNOSIS — M62838 Other muscle spasm: Secondary | ICD-10-CM | POA: Diagnosis not present

## 2019-03-28 DIAGNOSIS — G44009 Cluster headache syndrome, unspecified, not intractable: Secondary | ICD-10-CM | POA: Diagnosis not present

## 2019-06-09 DIAGNOSIS — M530 Cervicocranial syndrome: Secondary | ICD-10-CM | POA: Diagnosis not present

## 2019-06-09 DIAGNOSIS — M9901 Segmental and somatic dysfunction of cervical region: Secondary | ICD-10-CM | POA: Diagnosis not present

## 2019-06-14 DIAGNOSIS — M9901 Segmental and somatic dysfunction of cervical region: Secondary | ICD-10-CM | POA: Diagnosis not present

## 2019-06-14 DIAGNOSIS — M530 Cervicocranial syndrome: Secondary | ICD-10-CM | POA: Diagnosis not present

## 2019-06-15 DIAGNOSIS — M530 Cervicocranial syndrome: Secondary | ICD-10-CM | POA: Diagnosis not present

## 2019-06-15 DIAGNOSIS — M9901 Segmental and somatic dysfunction of cervical region: Secondary | ICD-10-CM | POA: Diagnosis not present

## 2019-06-21 DIAGNOSIS — M9901 Segmental and somatic dysfunction of cervical region: Secondary | ICD-10-CM | POA: Diagnosis not present

## 2019-06-21 DIAGNOSIS — M530 Cervicocranial syndrome: Secondary | ICD-10-CM | POA: Diagnosis not present

## 2019-06-22 DIAGNOSIS — M9901 Segmental and somatic dysfunction of cervical region: Secondary | ICD-10-CM | POA: Diagnosis not present

## 2019-06-22 DIAGNOSIS — M530 Cervicocranial syndrome: Secondary | ICD-10-CM | POA: Diagnosis not present

## 2019-06-28 DIAGNOSIS — M530 Cervicocranial syndrome: Secondary | ICD-10-CM | POA: Diagnosis not present

## 2019-06-28 DIAGNOSIS — M9901 Segmental and somatic dysfunction of cervical region: Secondary | ICD-10-CM | POA: Diagnosis not present

## 2019-06-29 DIAGNOSIS — M9901 Segmental and somatic dysfunction of cervical region: Secondary | ICD-10-CM | POA: Diagnosis not present

## 2019-06-29 DIAGNOSIS — M530 Cervicocranial syndrome: Secondary | ICD-10-CM | POA: Diagnosis not present

## 2019-07-05 DIAGNOSIS — M9901 Segmental and somatic dysfunction of cervical region: Secondary | ICD-10-CM | POA: Diagnosis not present

## 2019-07-05 DIAGNOSIS — M530 Cervicocranial syndrome: Secondary | ICD-10-CM | POA: Diagnosis not present

## 2019-07-06 DIAGNOSIS — M530 Cervicocranial syndrome: Secondary | ICD-10-CM | POA: Diagnosis not present

## 2019-07-06 DIAGNOSIS — M9901 Segmental and somatic dysfunction of cervical region: Secondary | ICD-10-CM | POA: Diagnosis not present

## 2019-07-13 DIAGNOSIS — M530 Cervicocranial syndrome: Secondary | ICD-10-CM | POA: Diagnosis not present

## 2019-07-13 DIAGNOSIS — M9901 Segmental and somatic dysfunction of cervical region: Secondary | ICD-10-CM | POA: Diagnosis not present

## 2019-07-19 DIAGNOSIS — M9901 Segmental and somatic dysfunction of cervical region: Secondary | ICD-10-CM | POA: Diagnosis not present

## 2019-07-19 DIAGNOSIS — M530 Cervicocranial syndrome: Secondary | ICD-10-CM | POA: Diagnosis not present

## 2019-07-20 DIAGNOSIS — M530 Cervicocranial syndrome: Secondary | ICD-10-CM | POA: Diagnosis not present

## 2019-07-20 DIAGNOSIS — M9901 Segmental and somatic dysfunction of cervical region: Secondary | ICD-10-CM | POA: Diagnosis not present

## 2019-07-26 DIAGNOSIS — M530 Cervicocranial syndrome: Secondary | ICD-10-CM | POA: Diagnosis not present

## 2019-07-26 DIAGNOSIS — M9901 Segmental and somatic dysfunction of cervical region: Secondary | ICD-10-CM | POA: Diagnosis not present

## 2019-07-29 ENCOUNTER — Encounter: Payer: BC Managed Care – PPO | Admitting: Internal Medicine

## 2019-07-29 DIAGNOSIS — Z0289 Encounter for other administrative examinations: Secondary | ICD-10-CM

## 2019-12-02 DIAGNOSIS — G43809 Other migraine, not intractable, without status migrainosus: Secondary | ICD-10-CM | POA: Diagnosis not present

## 2019-12-02 DIAGNOSIS — G501 Atypical facial pain: Secondary | ICD-10-CM | POA: Diagnosis not present

## 2019-12-02 DIAGNOSIS — G508 Other disorders of trigeminal nerve: Secondary | ICD-10-CM | POA: Diagnosis not present

## 2019-12-02 DIAGNOSIS — M7918 Myalgia, other site: Secondary | ICD-10-CM | POA: Diagnosis not present

## 2020-02-11 DIAGNOSIS — R6884 Jaw pain: Secondary | ICD-10-CM | POA: Diagnosis not present

## 2020-02-11 DIAGNOSIS — G8929 Other chronic pain: Secondary | ICD-10-CM | POA: Diagnosis not present

## 2020-02-15 ENCOUNTER — Encounter: Payer: Self-pay | Admitting: Internal Medicine

## 2020-02-15 ENCOUNTER — Other Ambulatory Visit: Payer: Self-pay

## 2020-02-15 ENCOUNTER — Telehealth (INDEPENDENT_AMBULATORY_CARE_PROVIDER_SITE_OTHER): Payer: BC Managed Care – PPO | Admitting: Internal Medicine

## 2020-02-15 VITALS — Ht 73.0 in | Wt 155.0 lb

## 2020-02-15 DIAGNOSIS — G500 Trigeminal neuralgia: Secondary | ICD-10-CM | POA: Diagnosis not present

## 2020-02-15 MED ORDER — PREDNISONE 10 MG PO TABS: ORAL_TABLET | ORAL | 0 refills | Status: DC

## 2020-02-15 NOTE — Progress Notes (Signed)
Pre visit review using our clinic review tool, if applicable. No additional management support is needed unless otherwise documented below in the visit note. 

## 2020-02-15 NOTE — Progress Notes (Signed)
   Subjective:    Patient ID: Alan Branch, male    DOB: 1996-01-26, 24 y.o.   MRN: [id number]  DOS:  02/15/2020 Type of visit - description: Virtual Visit via Video Note  I connected with the above patient  by a video enabled telemedicine application and verified that I am speaking with the correct person using two identifiers.   THIS ENCOUNTER IS A VIRTUAL VISIT DUE TO COVID-19 - PATIENT WAS NOT SEEN IN THE OFFICE. PATIENT HAS CONSENTED TO VIRTUAL VISIT / TELEMEDICINE VISIT   Location of patient: home  Location of provider: office  Persons participating in the virtual visit: patient, provider   I discussed the limitations of evaluation and management by telemedicine and the availability of in person appointments. The patient expressed understanding and agreed to proceed.  Acute The patient was seen  at [facility] for the trigeminal neuralgia management, was recommended to have a prednisone taper ready in case he develop another bout of trigeminal neuralgia.  Was requested to get that through primary care.    Review of Systems See above   Past Medical History:  Diagnosis Date  . Trigeminal neuralgia 2016  . Unintended weight loss 2016    Past Surgical History:  Procedure Laterality Date  . ADENOIDECTOMY  around 2011  . WISDOM TOOTH EXTRACTION  2016    Allergies as of 02/15/2020   No Known Allergies     Medication List       Accurate as of February 15, 2020  4:17 PM. If you have any questions, ask your nurse or doctor.        azelastine 0.1 % nasal spray Commonly known as: ASTELIN Place 2 sprays into both nostrils at bedtime as needed for rhinitis. Use in each nostril as directed   azithromycin 250 MG tablet Commonly known as: Zithromax Z-Pak 2 tabs a day the first day, then 1 tab a day x 4 days          Objective:   Physical Exam Ht 6\' 1"  (1.854 m)   Wt 155 lb (70.3 kg)   BMI 20.45 kg/m  This is a video visit, is alert oriented x3, in no distress.     Assessment     ssessment Trigeminal neuralgia after a dental procedure, chronic jaw pain, per [facility] Weight loss d/t [place]  PLAN Trigeminal neuralgia: Under the care of pain management at [facility], they would like the patient to have a prednisone taper ready in case he needs it, Rx sent. He has been also referred for nerve block. Overall seems like his symptoms are under better control compared to a couple of years ago.   I discussed the assessment and treatment plan with the patient. The patient was provided an opportunity to ask questions and all were answered. The patient agreed with the plan and demonstrated an understanding of the instructions.   The patient was advised to call back or seek an in-person evaluation if the symptoms worsen or if the condition fails to improve as anticipated.

## 2020-02-17 NOTE — Assessment & Plan Note (Signed)
Trigeminal neuralgia: Under the care of pain management at [facility], they would like the patient to have a prednisone taper ready in case he needs it, Rx sent. He has been also referred for nerve block. Overall seems like his symptoms are under better control compared to a couple of years ago.

## 2020-03-10 DIAGNOSIS — G8929 Other chronic pain: Secondary | ICD-10-CM | POA: Diagnosis not present

## 2020-03-10 DIAGNOSIS — G500 Trigeminal neuralgia: Secondary | ICD-10-CM | POA: Diagnosis not present

## 2020-03-10 DIAGNOSIS — R6884 Jaw pain: Secondary | ICD-10-CM | POA: Diagnosis not present

## 2020-05-30 DIAGNOSIS — Z03818 Encounter for observation for suspected exposure to other biological agents ruled out: Secondary | ICD-10-CM | POA: Diagnosis not present

## 2020-05-30 DIAGNOSIS — R509 Fever, unspecified: Secondary | ICD-10-CM | POA: Diagnosis not present

## 2020-05-30 DIAGNOSIS — R52 Pain, unspecified: Secondary | ICD-10-CM | POA: Diagnosis not present

## 2020-05-30 DIAGNOSIS — R6883 Chills (without fever): Secondary | ICD-10-CM | POA: Diagnosis not present

## 2020-06-02 ENCOUNTER — Encounter: Payer: BC Managed Care – PPO | Admitting: Internal Medicine

## 2020-06-16 ENCOUNTER — Encounter: Payer: BC Managed Care – PPO | Admitting: Internal Medicine

## 2020-06-16 DIAGNOSIS — G500 Trigeminal neuralgia: Secondary | ICD-10-CM | POA: Diagnosis not present

## 2020-06-16 DIAGNOSIS — R519 Headache, unspecified: Secondary | ICD-10-CM | POA: Diagnosis not present

## 2020-06-29 ENCOUNTER — Telehealth: Payer: Self-pay | Admitting: Internal Medicine

## 2020-06-29 DIAGNOSIS — R519 Headache, unspecified: Secondary | ICD-10-CM | POA: Diagnosis not present

## 2020-06-29 DIAGNOSIS — G500 Trigeminal neuralgia: Secondary | ICD-10-CM | POA: Diagnosis not present

## 2020-06-29 NOTE — Telephone Encounter (Signed)
Patient is wondering if you could recommend him a [facility] pcp. Unfortunately, his insurance has change and it would be out of network for him to continue seeing you.

## 2020-06-29 NOTE — Telephone Encounter (Signed)
Advised pt to call the provider number on the back of his new insurance card or to go directly to [facility] .-JMA

## 2020-06-30 ENCOUNTER — Encounter: Payer: BC Managed Care – PPO | Admitting: Internal Medicine

## 2020-06-30 DIAGNOSIS — R519 Headache, unspecified: Secondary | ICD-10-CM | POA: Diagnosis not present

## 2020-06-30 DIAGNOSIS — G500 Trigeminal neuralgia: Secondary | ICD-10-CM | POA: Diagnosis not present

## 2020-07-14 DIAGNOSIS — G500 Trigeminal neuralgia: Secondary | ICD-10-CM | POA: Diagnosis not present

## 2021-04-13 ENCOUNTER — Encounter: Payer: BC Managed Care – PPO | Admitting: Internal Medicine

## 2021-04-27 ENCOUNTER — Encounter: Payer: BC Managed Care – PPO | Admitting: Internal Medicine

## 2021-05-11 ENCOUNTER — Encounter: Payer: BC Managed Care – PPO | Admitting: Internal Medicine

## 2021-06-04 ENCOUNTER — Telehealth: Payer: Self-pay | Admitting: Internal Medicine

## 2021-06-04 DIAGNOSIS — Z0000 Encounter for general adult medical examination without abnormal findings: Secondary | ICD-10-CM

## 2021-06-04 DIAGNOSIS — Z1159 Encounter for screening for other viral diseases: Secondary | ICD-10-CM

## 2021-06-04 NOTE — Telephone Encounter (Signed)
CMP, CBC, TSH, FLP

## 2021-06-04 NOTE — Telephone Encounter (Signed)
Pt scheduled for cpx on 06/15/21. Requesting labs prior please advise.

## 2021-06-04 NOTE — Telephone Encounter (Signed)
Pt would like to come in this week for labs only. No active request. Please advise.

## 2021-06-04 NOTE — Telephone Encounter (Signed)
Pt sched 1/13 @ 745 for labs

## 2021-06-04 NOTE — Telephone Encounter (Signed)
Labs have been placed. Corshema- will you call Pt and schedule labs at his convenience (several days prior to his cpx on 06/15/21 please).

## 2021-06-08 ENCOUNTER — Other Ambulatory Visit: Payer: 59

## 2021-06-12 ENCOUNTER — Other Ambulatory Visit (INDEPENDENT_AMBULATORY_CARE_PROVIDER_SITE_OTHER): Payer: 59

## 2021-06-12 DIAGNOSIS — Z0000 Encounter for general adult medical examination without abnormal findings: Secondary | ICD-10-CM

## 2021-06-12 DIAGNOSIS — Z1159 Encounter for screening for other viral diseases: Secondary | ICD-10-CM

## 2021-06-12 LAB — CBC WITH DIFFERENTIAL/PLATELET
Basophils Absolute: 0 10*3/uL (ref 0.0–0.1)
Basophils Relative: 0.4 % (ref 0.0–3.0)
Eosinophils Absolute: 0.2 10*3/uL (ref 0.0–0.7)
Eosinophils Relative: 2.2 % (ref 0.0–5.0)
HCT: 48.4 % (ref 39.0–52.0)
Hemoglobin: 15.8 g/dL (ref 13.0–17.0)
Lymphocytes Relative: 31.6 % (ref 12.0–46.0)
Lymphs Abs: 2.5 10*3/uL (ref 0.7–4.0)
MCHC: 32.7 g/dL (ref 30.0–36.0)
MCV: 91.3 fl (ref 78.0–100.0)
Monocytes Absolute: 0.6 10*3/uL (ref 0.1–1.0)
Monocytes Relative: 7.3 % (ref 3.0–12.0)
Neutro Abs: 4.5 10*3/uL (ref 1.4–7.7)
Neutrophils Relative %: 58.5 % (ref 43.0–77.0)
Platelets: 260 10*3/uL (ref 150.0–400.0)
RBC: 5.3 Mil/uL (ref 4.22–5.81)
RDW: 13.7 % (ref 11.5–15.5)
WBC: 7.8 10*3/uL (ref 4.0–10.5)

## 2021-06-12 LAB — COMPREHENSIVE METABOLIC PANEL
ALT: 11 U/L (ref 0–53)
AST: 14 U/L (ref 0–37)
Albumin: 5.1 g/dL (ref 3.5–5.2)
Alkaline Phosphatase: 65 U/L (ref 39–117)
BUN: 14 mg/dL (ref 6–23)
CO2: 27 mEq/L (ref 19–32)
Calcium: 9.5 mg/dL (ref 8.4–10.5)
Chloride: 101 mEq/L (ref 96–112)
Creatinine, Ser: 0.95 mg/dL (ref 0.40–1.50)
GFR: 112.34 mL/min (ref >60.00)
Glucose, Bld: 104 mg/dL — ABNORMAL HIGH (ref 70–99)
Potassium: 4.2 mEq/L (ref 3.5–5.1)
Sodium: 139 mEq/L (ref 135–145)
Total Bilirubin: 0.2 mg/dL (ref 0.2–1.2)
Total Protein: 7.5 g/dL (ref 6.0–8.3)

## 2021-06-12 LAB — LIPID PANEL
Cholesterol: 176 mg/dL (ref 0–200)
HDL: 47.9 mg/dL (ref >39.00)
LDL Cholesterol: 116 mg/dL — ABNORMAL HIGH (ref 0–99)
NonHDL: 128.02
Total CHOL/HDL Ratio: 4
Triglycerides: 58 mg/dL (ref 0.0–149.0)
VLDL: 11.6 mg/dL (ref 0.0–40.0)

## 2021-06-12 LAB — TSH: TSH: 1.04 u[IU]/mL (ref 0.35–5.50)

## 2021-06-13 LAB — HEPATITIS C ANTIBODY
Hepatitis C Ab: NONREACTIVE
SIGNAL TO CUT-OFF: 0.07 (ref <1.00)

## 2021-06-15 ENCOUNTER — Ambulatory Visit (INDEPENDENT_AMBULATORY_CARE_PROVIDER_SITE_OTHER): Payer: 59 | Admitting: Internal Medicine

## 2021-06-15 ENCOUNTER — Encounter: Payer: Self-pay | Admitting: Internal Medicine

## 2021-06-15 VITALS — BP 106/76 | HR 83 | Temp 97.6°F | Resp 16 | Ht 73.0 in | Wt 151.4 lb

## 2021-06-15 DIAGNOSIS — Z0000 Encounter for general adult medical examination without abnormal findings: Secondary | ICD-10-CM

## 2021-06-15 DIAGNOSIS — G500 Trigeminal neuralgia: Secondary | ICD-10-CM

## 2021-06-15 NOTE — Progress Notes (Signed)
Subjective:    Patient ID: Alan Branch, male    DOB: 1996-02-18, 26 y.o.   MRN: [id number]  DOS:  06/15/2021 Type of visit - description: cpx Here for CPX. The patient continue with on and off severe pain related to trigeminal neuralgia.  Sees multiple specialists. The latest episode started about 2 weeks ago, pain is mostly on the right and some on the left jaw.  Pain at x7/10. I ask if he feels depressed and the answer was no, he is occasional down but not depressed.  No suicidal ideas.   Review of Systems  Other than above, a 14 point review of systems is negative       Past Medical History:  Diagnosis Date   Trigeminal neuralgia 2016   Unintended weight loss 2016    Past Surgical History:  Procedure Laterality Date   ADENOIDECTOMY  around 2011   WISDOM TOOTH EXTRACTION  2016   Social History   Socioeconomic History   Marital status: Single    Spouse name: Not on file   Number of children: 0   Years of education: Not on file   Highest education level: Not on file  Occupational History   Occupation: Tourist information centre manager, driver  Tobacco Use   Smoking status: Never   Smokeless tobacco: Never  Substance and Sexual Activity   Alcohol use: No   Drug use: No   Sexual activity: Not on file  Other Topics Concern   Not on file  Social History Narrative   Household: Father , step mom    Social Determinants of Health   Financial Resource Strain: Not on file  Food Insecurity: Not on file  Transportation Needs: Not on file  Physical Activity: Not on file  Stress: Not on file  Social Connections: Not on file  Intimate Partner Violence: Not on file    Current Outpatient Medications  Medication Instructions   clonazePAM (KLONOPIN) 1 mg, Oral, Daily at bedtime   memantine (NAMENDA) 10 MG tablet 1 tablet, Oral, 2 times daily   mirtazapine (REMERON) 15 mg, Oral, Daily at bedtime   OXcarbazepine (TRILEPTAL) 900 mg, Oral, Daily   zonisamide (ZONEGRAN) 50 mg, Daily        Objective:   Physical Exam BP 106/76 (BP Location: Left Arm, Patient Position: Sitting, Cuff Size: Small)    Pulse 83    Temp 97.6 F (36.4 C) (Oral)    Resp 16    Ht 6\' 1"  (1.854 m)    Wt 151 lb 6 oz (68.7 kg)    SpO2 97%    BMI 19.97 kg/m  General: Well developed, NAD, BMI noted Neck: No  thyromegaly  HEENT:  Normocephalic . Face symmetric, atraumatic Lungs:  CTA B Normal respiratory effort, no intercostal retractions, no accessory muscle use. Heart: RRR,  no murmur.  Abdomen:  Not distended, soft, non-tender. No rebound or rigidity.   Lower extremities: no pretibial edema bilaterally  Skin: Exposed areas without rash. Not pale. Not jaundice Neurologic:  alert & oriented X3.  Speech normal, gait appropriate for age and unassisted Strength symmetric and appropriate for age.  Psych: Cognition and judgment appear intact.  Cooperative with normal attention span and concentration.  Behavior appropriate. No anxious or depressed appearing.     Assessment    ASSESSMENT Trigeminal neuralgia after a dental procedure, chronic jaw pain, per [facility] Weight loss d/t [place]   PLAN Here for CPX Trigeminal neuralgia: Follow-up by specialists at [facility].  Pain is on  and off, most recent spell started 2 weeks ago, in a lot of pain currently. Fortunately he has a strong family support including his father, mother and  twin brother. Denies any suicidal ideas. The father told me today that they are thinking about going to [facility] for second opinion which I think is a great idea. Emotional support provided. He always lose weight with the spells of TN, the team at [facility] is looking for a nutritionist in this area. RTC 1 year     This visit occurred during the SARS-CoV-2 public health emergency.  Safety protocols were in place, including screening questions prior to the visit, additional usage of staff PPE, and extensive cleaning of exam room while observing appropriate contact time as  indicated for disinfecting solutions.

## 2021-06-15 NOTE — Patient Instructions (Signed)
    GO TO THE FRONT DESK, PLEASE SCHEDULE YOUR APPOINTMENTS Come back for   a physical in 1 year  

## 2021-06-17 ENCOUNTER — Encounter: Payer: Self-pay | Admitting: Internal Medicine

## 2021-06-17 NOTE — Assessment & Plan Note (Signed)
Tdap 2018 Menactra: Completed Bexsero: Completed HPV: Completed Hepatitis A serology negative, could get a series of vaccines, he declines. COVID-vaccine: never , declined  Flu shot: Declined  Labs: Reviewed Blood work was done ahead of the visit, all labs okay and reviewed with the patient. We talked about safe sex, safe driving self testicular exam

## 2021-06-17 NOTE — Assessment & Plan Note (Signed)
Here for CPX Trigeminal neuralgia: Follow-up by specialists at [facility].  Pain is on and off, most recent spell started 2 weeks ago, in a lot of pain currently. Fortunately he has a strong family support including his father, mother and  twin brother. Denies any suicidal ideas. The father told me today that they are thinking about going to [facility] for second opinion which I think is a great idea. Emotional support provided. He always lose weight with the spells of [place], the team at [facility] is looking for a nutritionist in this area. RTC 1 year

## 2021-07-27 ENCOUNTER — Encounter: Payer: Self-pay | Admitting: Family Medicine

## 2021-07-27 ENCOUNTER — Ambulatory Visit: Payer: 59 | Admitting: Family Medicine

## 2021-07-27 VITALS — BP 105/70 | HR 97 | Temp 97.7°F | Resp 16 | Ht 72.0 in | Wt 154.6 lb

## 2021-07-27 DIAGNOSIS — J40 Bronchitis, not specified as acute or chronic: Secondary | ICD-10-CM

## 2021-07-27 MED ORDER — AZITHROMYCIN 250 MG PO TABS: ORAL_TABLET | ORAL | 0 refills | Status: DC

## 2021-07-27 MED ORDER — PREDNISONE 20 MG PO TABS: 40.0000 mg | ORAL_TABLET | Freq: Every day | ORAL | 0 refills | Status: DC

## 2021-07-27 NOTE — Progress Notes (Signed)
Chief Complaint  ?Patient presents with  ? Nasal Congestion  ?  Here for complains of congestion chest and head onset week ago   ? ? ?Alan Branch here for URI complaints. ? ?Duration: 8 d ?Associated symptoms: wheezing, shortness of breath, chest tightness, and coughing ?Denies: sinus congestion, sinus pain, rhinorrhea, itchy watery eyes, ear pain, ear drainage, myalgia, and fevers, N/V/D ?Treatment to date: Sudafed, Mucinex, Robitussin, DM ?Sick contacts: No ?Tested neg for covid.  ? ?Past Medical History:  ?Diagnosis Date  ? Trigeminal neuralgia 2016  ? Unintended weight loss 2016  ? ? ?Objective ?BP 105/70 (BP Location: Right Arm, Patient Position: Sitting, Cuff Size: Normal)   Pulse 97   Temp 97.7 ?F (36.5 ?C) (Oral)   Resp 16   Ht 6' (1.829 m)   Wt 154 lb 9.6 oz (70.1 kg)   SpO2 94%   BMI 20.97 kg/m?  ?General: Awake, alert, appears stated age ?HEENT: AT, [place], ears patent b/l and TM's neg, nares patent w/o discharge, pharynx pink and without exudates, MMM ?Neck: No masses or asymmetry ?Heart: RRR ?Lungs: faint wheezes heard at bases, no accessory muscle use ?Psych: Age appropriate judgment and insight, normal mood and affect ? ?Wheezy bronchitis - Plan: azithromycin (ZITHROMAX) 250 MG tablet, predniSONE (DELTASONE) 20 MG tablet ? ?5 d pred burst 40 mg/d, pocket rx for Zpak if no improvement in 2-3 days. Doubt he will need that though. Continue to push fluids, practice good hand hygiene, cover mouth when coughing. ?F/u prn. If starting to experience fevers, shaking, or shortness of breath, seek immediate care. ?Pt voiced understanding and agreement to the plan. ? ?Sharlene Dory, DO ?07/27/21 ?8:43 AM ? ?

## 2021-07-27 NOTE — Patient Instructions (Addendum)
Continue to push fluids, practice good hand hygiene, and cover your mouth if you cough. ? ?If you start having fevers, shaking or shortness of breath, seek immediate care. ? ?OK to take Tylenol 1000 mg (2 extra strength tabs) or 975 mg (3 regular strength tabs) every 6 hours as needed. ? ?Wait 2-3 days before taking the azithromycin and only take if you are not improving.  ? ?Let us know if you need anything. ?

## 2022-01-04 ENCOUNTER — Encounter: Payer: Self-pay | Admitting: Internal Medicine

## 2022-01-04 ENCOUNTER — Ambulatory Visit: Payer: 59 | Admitting: Internal Medicine

## 2022-01-04 VITALS — BP 112/70 | HR 75 | Temp 98.4°F | Ht 72.0 in | Wt 160.8 lb

## 2022-01-04 DIAGNOSIS — K648 Other hemorrhoids: Secondary | ICD-10-CM | POA: Diagnosis not present

## 2022-01-04 MED ORDER — HYDROCORTISONE ACETATE 25 MG RE SUPP: 25.0000 mg | Freq: Two times a day (BID) | RECTAL | 1 refills | Status: DC | PRN

## 2022-01-04 NOTE — Progress Notes (Unsigned)
   Subjective:    Patient ID: Alan Branch, male    DOB: 1995/06/21, 26 y.o.   MRN: [id number]  DOS:  01/04/2022 Type of visit - description: Acute Symptoms started 4 weeks ago: On rectal itching, some burning when he wipes.  Has noted few drops of blood in the toilet paper. He has a "raw feeling".  Denies systemic symptoms such as fever chills nausea or vomiting. No abdominal pain. Stools have been actually slightly loose.    Review of Systems See above   Past Medical History:  Diagnosis Date   Trigeminal neuralgia 2016   Unintended weight loss 2016    Past Surgical History:  Procedure Laterality Date   ADENOIDECTOMY  around 2011   WISDOM TOOTH EXTRACTION  2016    Current Outpatient Medications  Medication Instructions   clonazePAM (KLONOPIN) 1 mg, Oral, Daily at bedtime   mirtazapine (REMERON) 15 mg, Oral, Daily at bedtime   Oxcarbazepine (TRILEPTAL) 900 mg, Oral, 2 times daily       Objective:   Physical Exam BP 112/70   Pulse 75   Temp 98.4 F (36.9 C) (Oral)   Ht 6' (1.829 m)   Wt 160 lb 12.8 oz (72.9 kg)   SpO2 97%   BMI 21.81 kg/m  General:   Well developed, NAD, BMI noted.  HEENT:  Normocephalic . Face symmetric, atraumatic Abdomen:  Not distended, soft, non-tender. No rebound or rigidity. DRE: Normal sphincter tone, normal prostate.  No mass, no stools found. Anoscopy: With the patient consent, I introduced self lighted, well-lubricated anoscope. I found internal hemorrhoids, mostly at 6:00.  No active bleeding, patient tolerated well and no immediate complications. Skin: Not pale. Not jaundice Lower extremities: no pretibial edema bilaterally  Neurologic:  alert & oriented X3.  Speech normal, gait appropriate for age and unassisted Psych--  Cognition and judgment appear intact.  Cooperative with normal attention span and concentration.  Behavior appropriate. No anxious or depressed appearing.     Assessment     ASSESSMENT Trigeminal  neuralgia after a dental procedure, chronic jaw pain, per [facility] Weight loss d/t [place]   PLAN Internal hemorrhoids: This is a new issue for the patient.  Explained what hemorrhoids are. Recommend good hydration, high-fiber diet, Anusol HC.  Call if not gradually better.  Here for CPX Trigeminal neuralgia: Follow-up by specialists at [facility].  Pain is on and off, most recent spell started 2 weeks ago, in a lot of pain currently. Fortunately he has a strong family support including his father, mother and  twin brother. Denies any suicidal ideas. The father told me today that they are thinking about going to [facility] for second opinion which I think is a great idea. Emotional support provided. He always lose weight with the spells of [place], the team at [facility] is looking for a nutritionist in this area. RTC 1 year

## 2022-01-04 NOTE — Patient Instructions (Signed)
You have internal hemorrhoids  Be sure you always drink plenty of water  Get a  fiber rich diet by eating fruit and vegetables.  You can also take Metamucil.  You can use the suppositories once or twice daily as needed until better  If you have severe pain, swelling or bleeding from the area: Seek medical attention.    Hemorrhoids Hemorrhoids are swollen veins in and around the rectum or anus. There are two types of hemorrhoids: Internal hemorrhoids. These occur in the veins that are just inside the rectum. They may poke through to the outside and become irritated and painful. External hemorrhoids. These occur in the veins that are outside the anus and can be felt as a painful swelling or hard lump near the anus. Most hemorrhoids do not cause serious problems, and they can be managed with home treatments such as diet and lifestyle changes. If home treatments do not help the symptoms, procedures can be done to shrink or remove the hemorrhoids. What are the causes? This condition is caused by increased pressure in the anal area. This pressure may result from various things, including: Constipation. Straining to have a bowel movement. Diarrhea. Pregnancy. Obesity. Sitting for long periods of time. Heavy lifting or other activity that causes you to strain. Anal sex. Riding a bike for a long period of time. What are the signs or symptoms? Symptoms of this condition include: Pain. Anal itching or irritation. Rectal bleeding. Leakage of stool (feces). Anal swelling. One or more lumps around the anus. How is this diagnosed? This condition can often be diagnosed through a visual exam. Other exams or tests may also be done, such as: An exam that involves feeling the rectal area with a gloved hand (digital rectal exam). An exam of the anal canal that is done using a small tube (anoscope). A blood test, if you have lost a significant amount of blood. A test to look inside the colon using  a flexible tube with a camera on the end (sigmoidoscopy or colonoscopy). How is this treated? This condition can usually be treated at home. However, various procedures may be done if dietary changes, lifestyle changes, and other home treatments do not help your symptoms. These procedures can help make the hemorrhoids smaller or remove them completely. Some of these procedures involve surgery, and others do not. Common procedures include: Rubber band ligation. Rubber bands are placed at the base of the hemorrhoids to cut off their blood supply. Sclerotherapy. Medicine is injected into the hemorrhoids to shrink them. Infrared coagulation. A type of light energy is used to get rid of the hemorrhoids. Hemorrhoidectomy surgery. The hemorrhoids are surgically removed, and the veins that supply them are tied off. Stapled hemorrhoidopexy surgery. The surgeon staples the base of the hemorrhoid to the rectal wall. Follow these instructions at home: Eating and drinking  Eat foods that have a lot of fiber in them, such as whole grains, beans, nuts, fruits, and vegetables. Ask your health care provider about taking products that have added fiber (fiber supplements). Reduce the amount of fat in your diet. You can do this by eating low-fat dairy products, eating less red meat, and avoiding processed foods. Drink enough fluid to keep your urine pale yellow. Managing pain and swelling  Take warm sitz baths for 20 minutes, 3-4 times a day to ease pain and discomfort. You may do this in a bathtub or using a portable sitz bath that fits over the toilet. If directed, apply ice to  the affected area. Using ice packs between sitz baths may be helpful. Put ice in a plastic bag. Place a towel between your skin and the bag. Leave the ice on for 20 minutes, 2-3 times a day. General instructions Take over-the-counter and prescription medicines only as told by your health care provider. Use medicated creams or  suppositories as told. Get regular exercise. Ask your health care provider how much and what kind of exercise is best for you. In general, you should do moderate exercise for at least 30 minutes on most days of the week (150 minutes each week). This can include activities such as walking, biking, or yoga. Go to the bathroom when you have the urge to have a bowel movement. Do not wait. Avoid straining to have bowel movements. Keep the anal area dry and clean. Use wet toilet paper or moist towelettes after a bowel movement. Do not sit on the toilet for long periods of time. This increases blood pooling and pain. Keep all follow-up visits as told by your health care provider. This is important. Contact a health care provider if you have: Increasing pain and swelling that are not controlled by treatment or medicine. Difficulty having a bowel movement, or you are unable to have a bowel movement. Pain or inflammation outside the area of the hemorrhoids. Get help right away if you have: Uncontrolled bleeding from your rectum. Summary Hemorrhoids are swollen veins in and around the rectum or anus. Most hemorrhoids can be managed with home treatments such as diet and lifestyle changes. Taking warm sitz baths can help ease pain and discomfort. In severe cases, procedures or surgery can be done to shrink or remove the hemorrhoids. This information is not intended to replace advice given to you by your health care provider. Make sure you discuss any questions you have with your health care provider. Document Revised: 11/22/2020 Document Reviewed: 11/22/2020 Elsevier Patient Education  2023 [employer].

## 2022-01-05 DIAGNOSIS — K648 Other hemorrhoids: Secondary | ICD-10-CM | POA: Insufficient documentation

## 2022-01-05 NOTE — Assessment & Plan Note (Signed)
Internal hemorrhoids: This is a new issue for the patient.  Explained what hemorrhoids are. Recommend good hydration, high-fiber diet, Anusol HC.  Call if not gradually better.

## 2022-01-30 ENCOUNTER — Telehealth: Payer: Self-pay | Admitting: Internal Medicine

## 2022-01-30 MED ORDER — HYDROCORTISONE ACETATE 25 MG RE SUPP: 25.0000 mg | Freq: Two times a day (BID) | RECTAL | 1 refills | Status: DC | PRN

## 2022-01-30 NOTE — Telephone Encounter (Signed)
Patient called to advise that he is still having issues with his hemorrhoids and wanted to know if another prescription could be called in for the suppository. Please call patient to advise. Patient is okay to come in for appointment if that is necessary.

## 2022-01-30 NOTE — Telephone Encounter (Signed)
Rx sent 

## 2022-02-04 ENCOUNTER — Encounter: Payer: Self-pay | Admitting: Internal Medicine

## 2022-02-19 ENCOUNTER — Encounter: Payer: Self-pay | Admitting: Internal Medicine

## 2022-02-19 DIAGNOSIS — K649 Unspecified hemorrhoids: Secondary | ICD-10-CM

## 2022-02-20 NOTE — Telephone Encounter (Signed)
Letter sent to [place]. GI referral placed.

## 2022-02-26 ENCOUNTER — Encounter: Payer: Self-pay | Admitting: Gastroenterology

## 2022-02-27 ENCOUNTER — Telehealth: Payer: Self-pay | Admitting: Internal Medicine

## 2022-02-27 NOTE — Telephone Encounter (Signed)
Okay to send Singulair x6 months, okay to send Astepro 2 sprays on each side of the nose twice daily (may be cheaper with a prescription)

## 2022-02-27 NOTE — Telephone Encounter (Signed)
Patient's dad called requesting a call back to discuss a medication. Please advise.

## 2022-02-27 NOTE — Telephone Encounter (Signed)
Spoke w/ Pt's dad, Herbie Baltimore, Pt is having a flare-up of his trigeminal neuralgia, they believe due to sinus concerns. Herbie Baltimore was wondering if we could send a prescription for Astelin and montelukast to see if this helps w/ his sx's. I did make Herbie Baltimore aware that Astelin is now over the counter as Astepro. Pt does has an appt with you on Friday.

## 2022-02-28 MED ORDER — MONTELUKAST SODIUM 10 MG PO TABS: 10.0000 mg | ORAL_TABLET | Freq: Every day | ORAL | 1 refills | Status: DC

## 2022-02-28 MED ORDER — AZELASTINE HCL 0.1 % NA SOLN: 2.0000 | Freq: Two times a day (BID) | NASAL | 12 refills | Status: DC

## 2022-02-28 NOTE — Telephone Encounter (Signed)
Spoke w/ Alan Branch- informed that Rx's has been sent. Informed to keep appt for tomorrow. Alan Branch verbalized understanding.

## 2022-03-01 ENCOUNTER — Encounter: Payer: Self-pay | Admitting: Internal Medicine

## 2022-03-01 ENCOUNTER — Ambulatory Visit: Payer: 59 | Admitting: Internal Medicine

## 2022-03-01 VITALS — BP 116/74 | HR 84 | Temp 98.0°F | Resp 16 | Ht 72.0 in | Wt 160.5 lb

## 2022-03-01 DIAGNOSIS — G500 Trigeminal neuralgia: Secondary | ICD-10-CM

## 2022-03-01 DIAGNOSIS — J029 Acute pharyngitis, unspecified: Secondary | ICD-10-CM

## 2022-03-01 DIAGNOSIS — J309 Allergic rhinitis, unspecified: Secondary | ICD-10-CM

## 2022-03-01 MED ORDER — FLUTICASONE PROPIONATE 50 MCG/ACT NA SUSP: 2.0000 | Freq: Every day | NASAL | 5 refills | Status: DC

## 2022-03-01 MED ORDER — FEXOFENADINE HCL 180 MG PO TABS: 180.0000 mg | ORAL_TABLET | Freq: Every day | ORAL | 1 refills | Status: DC

## 2022-03-01 NOTE — Assessment & Plan Note (Signed)
Sinusitis: Symptoms as described above, probably allergic sinusitis.  They wonder if that is triggering trigeminal neuralgia .Will treat allergic sinusitis aggressively to see if that helps with the exacerbation of TN. Plan:  Wendie Chess, Flonase, Singulair.  The father advised to try a Nettie-pot and that is okay. Throat is a slightly red, get throat culture. Trig Neuralgia Symptoms exacerbated lately, wonders if exacerbation related to sinusitis or changes in the weather, I don't know the answer but will see if improves w/ allergy treatment;  to have a local injection by the specialist soon. Internal hemorrhoids: GI eval pending

## 2022-03-01 NOTE — Progress Notes (Signed)
   Subjective:    Patient ID: Alan Branch, male    DOB: May 28, 1995, 26 y.o.   MRN: [id number]  DOS:  03/01/2022 Type of visit - description: Acute, here with his father.  The patient was doing great all summer but  a month ago developed nasal congestion, sinus pressure, postnasal dripping and he started blowing nasal  white discharge.  Discharge was never bloody or colored. He has trigeminal neuralgia and symptoms started to bother him again after the sinus congestion started and wonders if there is a relationship.  No fever chills Occasional ear pressure + Sneezing. No itchy eyes or nose No cough or chest congestion  Review of Systems See above   Past Medical History:  Diagnosis Date   Trigeminal neuralgia 2016   Unintended weight loss 2016    Past Surgical History:  Procedure Laterality Date   ADENOIDECTOMY  around [place]  2016    Current Outpatient Medications  Medication Instructions   azelastine (ASTELIN) 0.1 % nasal spray 2 sprays, Each Nare, 2 times daily, Use in each nostril as directed   clonazePAM (KLONOPIN) 1 mg, Oral, Daily at bedtime   hydrocortisone (ANUSOL-HC) 25 mg, Rectal, 2 times daily PRN   mirtazapine (REMERON) 15 mg, Daily at bedtime   montelukast (SINGULAIR) 10 mg, Oral, Daily at bedtime   Oxcarbazepine (TRILEPTAL) 1,200 mg, Oral, Daily       Objective:   Physical Exam BP 116/74   Pulse 84   Temp 98 F (36.7 C) (Oral)   Resp 16   Ht 6' (1.829 m)   Wt 160 lb 8 oz (72.8 kg)   SpO2 98%   BMI 21.77 kg/m  General:   Well developed, NAD, BMI noted. HEENT:  Normocephalic . Face symmetric, atraumatic. Sinus: Slightly TTP? Nose: Congested Throat: Symmetric, slightly red with no discharge. Lungs:  CTA B Normal respiratory effort, no intercostal retractions, no accessory muscle use. Heart: RRR,  no murmur.  Lower extremities: no pretibial edema bilaterally  Skin: Not pale. Not jaundice Neurologic:  alert &  oriented X3.  Speech normal, gait appropriate for age and unassisted Psych--  Cognition and judgment appear intact.  Cooperative with normal attention span and concentration.  Behavior appropriate. No anxious or depressed appearing.      Assessment     ASSESSMENT Trigeminal neuralgia after a dental procedure, chronic jaw pain, per [facility] Weight loss d/t TN   PLAN Sinusitis: Symptoms as described above, probably allergic sinusitis.  They wonder if that is triggering trigeminal neuralgia .Will treat allergic sinusitis aggressively to see if that helps with the exacerbation of TN. Plan:  Wendie Chess, Flonase, Singulair.  The father advised to try a Nettie-pot and that is okay. Throat is a slightly red, get throat culture. Trig Neuralgia Symptoms exacerbated lately, wonders if exacerbation related to sinusitis or changes in the weather, I don't know the answer but will see if improves w/ allergy treatment;  to have a local injection by the specialist soon. Internal hemorrhoids: GI eval pending

## 2022-03-01 NOTE — Patient Instructions (Addendum)
For allergies: Start Allegra 180 mg over-the-counter: 1 tablet daily Start using the Astepro nasal spray twice daily Start using Flonase nasal spray once daily Start Singulair  Okay to use the nasal washes and see how that works Call if you are no better

## 2022-03-03 LAB — CULTURE, GROUP A STREP
MICRO NUMBER:: 14018471
SPECIMEN QUALITY:: ADEQUATE

## 2022-04-12 ENCOUNTER — Ambulatory Visit: Payer: 59 | Admitting: Gastroenterology

## 2022-04-30 ENCOUNTER — Ambulatory Visit: Payer: 59 | Admitting: Gastroenterology

## 2022-06-17 ENCOUNTER — Ambulatory Visit (INDEPENDENT_AMBULATORY_CARE_PROVIDER_SITE_OTHER): Payer: PRIVATE HEALTH INSURANCE | Admitting: Internal Medicine

## 2022-06-17 ENCOUNTER — Encounter: Payer: Self-pay | Admitting: Internal Medicine

## 2022-06-17 VITALS — BP 126/70 | HR 64 | Temp 98.2°F | Resp 16 | Ht 72.0 in | Wt 160.1 lb

## 2022-06-17 DIAGNOSIS — Z0000 Encounter for general adult medical examination without abnormal findings: Secondary | ICD-10-CM | POA: Diagnosis not present

## 2022-06-17 NOTE — Progress Notes (Signed)
   Subjective:    Patient ID: Alan Branch, male    DOB: May 27, 1996, 27 y.o.   MRN: [id number]  DOS:  06/17/2022 Type of visit - description: cpx Here for CPX. Reports he actually is doing great and has no concerns   Review of Systems   A 14 point review of systems is negative    Past Medical History:  Diagnosis Date   Trigeminal neuralgia 2016   Unintended weight loss 2016    Past Surgical History:  Procedure Laterality Date   ADENOIDECTOMY  around 2011   WISDOM TOOTH EXTRACTION  2016    Social History   Social History Narrative   Household: Father , step mom    Social History   Tobacco Use   Smoking status: Never   Smokeless tobacco: Never  Substance Use Topics   Alcohol use: No   Drug use: No   Social History   Social History Narrative   Household: Father , step mom     Current Outpatient Medications  Medication Instructions   azelastine (ASTELIN) 0.1 % nasal spray 2 sprays, Each Nare, 2 times daily, Use in each nostril as directed   clonazePAM (KLONOPIN) 1 mg, Oral, Daily at bedtime   fexofenadine (ALLEGRA) 180 mg, Oral, Daily   fluticasone (FLONASE) 50 MCG/ACT nasal spray 2 sprays, Each Nare, Daily   hydrocortisone (ANUSOL-HC) 25 mg, Rectal, 2 times daily PRN   montelukast (SINGULAIR) 10 mg, Oral, Daily at bedtime   Oxcarbazepine (TRILEPTAL) 1,200 mg, Oral, Daily       Objective:   Physical Exam BP 126/70   Pulse 64   Temp 98.2 F (36.8 C) (Oral)   Resp 16   Ht 6' (1.829 m)   Wt 160 lb 2 oz (72.6 kg)   SpO2 98%   BMI 21.72 kg/m  General: Well developed, NAD, BMI noted Neck: No  thyromegaly  HEENT:  Normocephalic . Face symmetric, atraumatic Lungs:  CTA B Normal respiratory effort, no intercostal retractions, no accessory muscle use. Heart: RRR,  no murmur.  Abdomen:  Not distended, soft, non-tender. No rebound or rigidity.   Lower extremities: no pretibial edema bilaterally  Skin: Exposed areas without rash. Not pale. Not  jaundice Neurologic:  alert & oriented X3.  Speech normal, gait appropriate for age and unassisted Strength symmetric and appropriate for age.  Psych: Cognition and judgment appear intact.  Cooperative with normal attention span and concentration.  Behavior appropriate. No anxious or depressed appearing.     Assessment    ASSESSMENT Trigeminal neuralgia after a dental procedure, chronic jaw pain, per [facility]    PLAN Here for CPX. State is doing great. Trigeminal neuralgia is under control at this point.  Next visit with neurology next month. Hemorrhoids: See last visit, symptoms have resolved, decided not to pursue further eval. RTC 1 year

## 2022-06-17 NOTE — Patient Instructions (Signed)
    GO TO THE FRONT DESK, PLEASE SCHEDULE YOUR APPOINTMENTS Come back for  a physical exam in 1 year

## 2022-06-18 ENCOUNTER — Encounter: Payer: Self-pay | Admitting: Internal Medicine

## 2022-06-18 NOTE — Assessment & Plan Note (Signed)
Here for CPX. State is doing great. Trigeminal neuralgia is under control at this point.  Next visit with neurology next month. Hemorrhoids: See last visit, symptoms have resolved, decided not to pursue further eval. RTC 1 year

## 2022-06-18 NOTE — Assessment & Plan Note (Signed)
Tdap 2018 Menactra: Completed Bexsero: Completed HPV: Completed Hepatitis A : declines. COVID-vaccine: never , declined  Flu shot: Declined  Labs: Reviewed, no need for blood work today. Patient education: Safe sex, self testicular exam and safe driving.

## 2022-09-02 ENCOUNTER — Other Ambulatory Visit: Payer: Self-pay | Admitting: Internal Medicine

## 2022-09-05 ENCOUNTER — Encounter: Payer: Self-pay | Admitting: Internal Medicine

## 2022-09-05 ENCOUNTER — Encounter: Payer: Self-pay | Admitting: Family Medicine

## 2022-09-05 ENCOUNTER — Ambulatory Visit (INDEPENDENT_AMBULATORY_CARE_PROVIDER_SITE_OTHER): Payer: PRIVATE HEALTH INSURANCE | Admitting: Family Medicine

## 2022-09-05 VITALS — BP 131/72 | HR 73 | Ht 72.0 in | Wt 159.0 lb

## 2022-09-05 DIAGNOSIS — K649 Unspecified hemorrhoids: Secondary | ICD-10-CM

## 2022-09-05 MED ORDER — HYDROCORTISONE ACETATE 25 MG RE SUPP: 25.0000 mg | Freq: Two times a day (BID) | RECTAL | 1 refills | Status: DC | PRN

## 2022-09-05 MED ORDER — HYDROCORTISONE (PERIANAL) 2.5 % EX CREA: 1.0000 | TOPICAL_CREAM | Freq: Two times a day (BID) | CUTANEOUS | 2 refills | Status: DC

## 2022-09-05 NOTE — Progress Notes (Signed)
   Acute Office Visit  Subjective:     Patient ID: Alan Branch, male    DOB: [birth year], 27 y.o.   MRN: [id number]  CC: hemorrhoids   HPI  Hemorrhoids: Patient complains of  worsening hemorrhoids . Onset of symptoms was 2 weeks ago with gradually worsening course since that time.Reports symptoms are similar to when he was first diagnosed with hemorrhoids by PCP last year  He describes symptoms as   anal pressure, fullness, itching,  . Treatment to date has been  Anusol suppository - reports it helped some, but he ran out. He feels like some of the discomfort is external as well, so he would like a cream to try.  . Patient denies  bleeding, constipation, urinary changes . He has 2 soft BMs per day, no straining.  At last flare, PCP referred to GI, but patient cancelled appointment since symptoms improved - he would like a new referral.       ROS All review of systems negative except what is listed in the HPI      Objective:    BP 131/72   Pulse 73   Ht 6' (1.829 m)   Wt 159 lb (72.1 kg)   SpO2 99%   BMI 21.56 kg/m    Physical Exam Vitals reviewed.  Constitutional:      Appearance: Normal appearance.  Genitourinary:    Comments: Declined rectal exam Neurological:     General: No focal deficit present.     Mental Status: He is alert and oriented to person, place, and time. Mental status is at baseline.  Psychiatric:        Mood and Affect: Mood normal.        Behavior: Behavior normal.        Thought Content: Thought content normal.        Judgment: Judgment normal.     No results found for any visits on 09/05/22.      Assessment & Plan:   Problem List Items Addressed This Visit   None Visit Diagnoses     Hemorrhoids, unspecified hemorrhoid type    -  Primary   Relevant Medications   hydrocortisone (PROCTO-MED HC) 2.5 % rectal cream   hydrocortisone (ANUSOL-HC) 25 MG suppository   Other Relevant Orders   Ambulatory referral to Gastroenterology      Refilling your suppositories and adding topical cream you can try if you find this more effective.  New referral to GI to further evaluate and treat.  Review attached handout - stay well hydrated, eat a high-fiber diet, avoid straining or sitting on toilet for long periods of time  Meds ordered this encounter  Medications   hydrocortisone (PROCTO-MED HC) 2.5 % rectal cream    Sig: Place 1 Application rectally 2 (two) times daily.    Dispense:  30 g    Refill:  2    Order Specific Question:   Supervising Provider    Answer:   Danise Edge A [[id number]]   hydrocortisone (ANUSOL-HC) 25 MG suppository    Sig: Place 1 suppository (25 mg total) rectally 2 (two) times daily as needed for hemorrhoids.    Dispense:  12 suppository    Refill:  1    Order Specific Question:   Supervising Provider    Answer:   Danise Edge A [[id number]]    Return if symptoms worsen or fail to improve.  Clayborne Dana, NP

## 2022-09-05 NOTE — Patient Instructions (Signed)
Refilling your suppositories and adding topical cream you can try if you find this more effective.  New referral to GI to further evaluate and treat.  Review attached handout - stay well hydrated, eat a high-fiber diet, avoid straining or sitting on toilet for long periods of time

## 2022-09-06 NOTE — Telephone Encounter (Signed)
Pt seen by Ladona Ridgel yesterday.

## 2022-11-15 ENCOUNTER — Ambulatory Visit: Payer: PRIVATE HEALTH INSURANCE | Admitting: Gastroenterology

## 2023-01-23 DIAGNOSIS — G500 Trigeminal neuralgia: Secondary | ICD-10-CM | POA: Diagnosis not present

## 2023-01-23 DIAGNOSIS — R519 Headache, unspecified: Secondary | ICD-10-CM | POA: Diagnosis not present

## 2023-02-11 DIAGNOSIS — R519 Headache, unspecified: Secondary | ICD-10-CM | POA: Diagnosis not present

## 2023-02-11 DIAGNOSIS — G500 Trigeminal neuralgia: Secondary | ICD-10-CM | POA: Diagnosis not present

## 2023-03-11 DIAGNOSIS — G509 Disorder of trigeminal nerve, unspecified: Secondary | ICD-10-CM | POA: Diagnosis not present

## 2023-03-11 DIAGNOSIS — G500 Trigeminal neuralgia: Secondary | ICD-10-CM | POA: Diagnosis not present

## 2023-03-11 DIAGNOSIS — Z0189 Encounter for other specified special examinations: Secondary | ICD-10-CM | POA: Diagnosis not present

## 2023-04-02 DIAGNOSIS — H5319 Other subjective visual disturbances: Secondary | ICD-10-CM | POA: Diagnosis not present

## 2023-05-05 ENCOUNTER — Ambulatory Visit (INDEPENDENT_AMBULATORY_CARE_PROVIDER_SITE_OTHER): Payer: BC Managed Care – PPO | Admitting: Internal Medicine

## 2023-05-05 ENCOUNTER — Encounter: Payer: Self-pay | Admitting: Internal Medicine

## 2023-05-05 VITALS — BP 126/70 | HR 74 | Temp 98.2°F | Resp 16 | Ht 72.0 in | Wt 162.4 lb

## 2023-05-05 DIAGNOSIS — G500 Trigeminal neuralgia: Secondary | ICD-10-CM | POA: Diagnosis not present

## 2023-05-05 DIAGNOSIS — R634 Abnormal weight loss: Secondary | ICD-10-CM

## 2023-05-05 NOTE — Progress Notes (Unsigned)
   Subjective:    Patient ID: Alan Branch, male    DOB: 19-May-1996, 27 y.o.   MRN: [id number]  DOS:  05/05/2023 Type of visit - description: Here for checkup  His job recommended to update his blood work. He feels well. Denies any chest pain or difficulty breathing No nausea vomiting.  Wt Readings from Last 3 Encounters:  05/05/23 162 lb 6 oz (73.7 kg)  09/05/22 159 lb (72.1 kg)  06/17/22 160 lb 2 oz (72.6 kg)     Review of Systems See above   Past Medical History:  Diagnosis Date   Trigeminal neuralgia 2016   Unintended weight loss 2016    Past Surgical History:  Procedure Laterality Date   ADENOIDECTOMY  around 2011   WISDOM TOOTH EXTRACTION  2016    Current Outpatient Medications  Medication Instructions   azelastine (ASTELIN) 0.1 % nasal spray 2 sprays, Each Nare, 2 times daily, Use in each nostril as directed   clonazePAM (KLONOPIN) 1 mg, Oral, Daily at bedtime   fexofenadine (ALLEGRA) 180 mg, Oral, Daily   fluticasone (FLONASE) 50 MCG/ACT nasal spray 2 sprays, Each Nare, Daily   hydrocortisone (ANUSOL-HC) 25 mg, Rectal, 2 times daily PRN   hydrocortisone (PROCTO-MED HC) 2.5 % rectal cream 1 Application, Rectal, 2 times daily   montelukast (SINGULAIR) 10 mg, Oral, Daily at bedtime   Oxcarbazepine (TRILEPTAL) 1,200 mg, Oral, Daily       Objective:   Physical Exam BP 126/70   Pulse 74   Temp 98.2 F (36.8 C) (Oral)   Resp 16   Ht 6' (1.829 m)   Wt 162 lb 6 oz (73.7 kg)   SpO2 97%   BMI 22.02 kg/m  General:   Well developed, NAD, BMI noted. HEENT:  Normocephalic . Face symmetric, atraumatic Lungs:  CTA B Normal respiratory effort, no intercostal retractions, no accessory muscle use. Heart: RRR,  no murmur.  Lower extremities: no pretibial edema bilaterally  Skin: Not pale. Not jaundice Neurologic:  alert & oriented X3.  Speech normal, gait appropriate for age and unassisted Psych--  Cognition and judgment appear intact.  Cooperative with  normal attention span and concentration.  Behavior appropriate. No anxious or depressed appearing.       Assessment   ASSESSMENT Trigeminal neuralgia after a dental procedure, chronic jaw pain, per [facility]    PLAN Doing well Need his blood work updated for work. Will get a A1c lipid and BMP. Vaccine advice provided, declined COVID and flu vaccine Trigeminal neuralgia: Controlled Weight loss: Not an issue anymore. RTC for a  formal  CPX in few months

## 2023-05-05 NOTE — Patient Instructions (Signed)
Proceed with blood work  Arrange for a physical exam in few months

## 2023-05-06 LAB — BASIC METABOLIC PANEL
BUN: 11 mg/dL (ref 6–23)
CO2: 32 meq/L (ref 19–32)
Calcium: 9.5 mg/dL (ref 8.4–10.5)
Chloride: 100 meq/L (ref 96–112)
Creatinine, Ser: 0.86 mg/dL (ref 0.40–1.50)
GFR: 119.92 mL/min (ref >60.00)
Glucose, Bld: 96 mg/dL (ref 70–99)
Potassium: 4.8 meq/L (ref 3.5–5.1)
Sodium: 139 meq/L (ref 135–145)

## 2023-05-06 LAB — LIPID PANEL
Cholesterol: 169 mg/dL (ref 0–200)
HDL: 54.3 mg/dL (ref >39.00)
LDL Cholesterol: 98 mg/dL (ref 0–99)
NonHDL: 114.73
Total CHOL/HDL Ratio: 3
Triglycerides: 82 mg/dL (ref 0.0–149.0)
VLDL: 16.4 mg/dL (ref 0.0–40.0)

## 2023-05-06 LAB — HEMOGLOBIN A1C: Hgb A1c MFr Bld: 5.5 % (ref 4.6–6.5)

## 2023-05-06 NOTE — Assessment & Plan Note (Signed)
Doing well Need his blood work updated for work. Will get a A1c lipid and BMP. Vaccine advice provided, declined COVID and flu vaccine Trigeminal neuralgia: Controlled Weight loss: Not an issue anymore. RTC for a  formal  CPX in few months

## 2023-05-07 ENCOUNTER — Telehealth: Payer: Self-pay

## 2023-05-07 NOTE — Telephone Encounter (Signed)
Spoke w/ Pt- informed that physical form is ready for pick up. Copy of form sent for scanning.

## 2023-06-10 ENCOUNTER — Telehealth: Payer: Self-pay | Admitting: Internal Medicine

## 2023-06-10 DIAGNOSIS — G500 Trigeminal neuralgia: Secondary | ICD-10-CM

## 2023-06-10 NOTE — Telephone Encounter (Signed)
 Please advise- is this a condition Dr. Dorma Russell would attempt to treat? Trigeminal neuralgia

## 2023-06-10 NOTE — Telephone Encounter (Signed)
 ENT referral placed.

## 2023-06-10 NOTE — Telephone Encounter (Signed)
 Copied from CRM [id number]. Topic: Referral - Request for Referral >> Jun 10, 2023  9:44 AM Isabell A wrote: Did the patient discuss referral with their provider in the last year? Yes, states provider knows about his condition.  (If No - schedule appointment) (If Yes - send message)  Appointment offered? Yes  Type of order/referral and detailed reason for visit: ENT, trigeminal neuralgia   Preference of office, provider, location: Dr.Eric Thaddeus, [facility]   If referral order, have you been seen by this specialty before? No (If Yes, this issue or another issue? When? Where?  Can we respond through MyChart? Yes   Dad calling on behalf of patient, requesting to speak with Kaylyn.

## 2023-06-10 NOTE — Telephone Encounter (Signed)
 We could arrange a referral for a second opinion although to my knowledge he has seen multiple very good specialist already

## 2023-06-16 ENCOUNTER — Ambulatory Visit (INDEPENDENT_AMBULATORY_CARE_PROVIDER_SITE_OTHER): Payer: BC Managed Care – PPO | Admitting: Internal Medicine

## 2023-06-16 ENCOUNTER — Encounter: Payer: Self-pay | Admitting: Internal Medicine

## 2023-06-16 VITALS — BP 112/60 | HR 62 | Temp 97.5°F | Resp 16 | Ht 72.0 in | Wt 154.4 lb

## 2023-06-16 DIAGNOSIS — G500 Trigeminal neuralgia: Secondary | ICD-10-CM

## 2023-06-16 MED ORDER — PREDNISONE 10 MG PO TABS: ORAL_TABLET | ORAL | 0 refills | Status: DC

## 2023-06-16 NOTE — Progress Notes (Unsigned)
   Subjective:    Patient ID: Alan Branch, male    DOB: 1996/03/25, 28 y.o.   MRN: [id number]  DOS:  06/16/2023 Type of visit - description: Acute  The patient has a long history of trigeminal neuralgia. On September 2024 had left TN block and was doing well until December 26 when the pain resurfaced. Started on the right and now is bilateral. Pain is similar to previous episodes but is very intense.   Wt Readings from Last 3 Encounters:  06/16/23 154 lb 6 oz (70 kg)  05/05/23 162 lb 6 oz (73.7 kg)  09/05/22 159 lb (72.1 kg)     Review of Systems See above   Past Medical History:  Diagnosis Date   Trigeminal neuralgia 2016   Unintended weight loss 2016    Past Surgical History:  Procedure Laterality Date   ADENOIDECTOMY  around 2011   WISDOM TOOTH EXTRACTION  2016    Current Outpatient Medications  Medication Instructions   clonazePAM (KLONOPIN) 1 mg, Daily at bedtime   fexofenadine (ALLEGRA) 180 mg, Oral, Daily   hydrocortisone (ANUSOL-HC) 25 mg, Rectal, 2 times daily PRN   hydrocortisone (PROCTO-MED HC) 2.5 % rectal cream 1 Application, Rectal, 2 times daily   montelukast (SINGULAIR) 10 mg, Oral, Daily at bedtime   Oxcarbazepine (TRILEPTAL) 1,200 mg, Daily       Objective:   Physical Exam BP 112/60   Pulse 62   Temp (!) 97.5 F (36.4 C) (Oral)   Resp 16   Ht 6' (1.829 m)   Wt 154 lb 6 oz (70 kg)   SpO2 99%   BMI 20.94 kg/m  General:   Well developed, NAD, BMI noted. HEENT:  Normocephalic . Face symmetric, atraumatic Oral cavity: Normal to inspection. TMs: After wax was removed from the L, TMs are normal Skin: Not pale. Not jaundice Neurologic:  alert & oriented X3.  Speech normal, gait appropriate for age and unassisted Psych--  Cognition and judgment appear intact.  Cooperative with normal attention span and concentration.  Behavior appropriate. No anxious or depressed appearing.      Assessment    ASSESSMENT Trigeminal neuralgia after  a dental procedure, chronic jaw pain, per [facility]    PLAN Trigeminal neuralgia: Long history of, has reach out to [facility] seeking help for this latest flareup that started December 26 with no obvious triggers. Pain is intense, having to follow a liquid diet, + weight loss noted. He is here in hope  I could prescribe prednisone which in the past he thinks provided temporary relief. Plan: Recommend to continue reaching out to [facility], prednisone prescribed, he also requested to see Dr. Dorma Russell for another opinion, he has office visit pending for later this week. He is very stressed about the situation, has a excellent family support, denies any violent thoughts or suicidal ideas. Marland Kitchen

## 2023-06-17 NOTE — Assessment & Plan Note (Signed)
Trigeminal neuralgia: Long history of, has reach out to [facility] seeking help for this latest flareup that started December 26 with no obvious triggers. Pain is intense, having to follow a liquid diet, + weight loss noted. He is here in hope  I could prescribe prednisone which in the past he thinks provided temporary relief. Plan: Recommend to continue reaching out to [facility], prednisone prescribed, he also requested to see Dr. Dorma Russell for another opinion, he has office visit pending for later this week. He is very stressed about the situation, has a excellent family support, denies any violent thoughts or suicidal ideas.

## 2023-06-20 ENCOUNTER — Encounter: Payer: PRIVATE HEALTH INSURANCE | Admitting: Internal Medicine

## 2023-06-24 DIAGNOSIS — G894 Chronic pain syndrome: Secondary | ICD-10-CM | POA: Diagnosis not present

## 2023-06-24 DIAGNOSIS — G500 Trigeminal neuralgia: Secondary | ICD-10-CM | POA: Diagnosis not present

## 2023-06-24 DIAGNOSIS — G43809 Other migraine, not intractable, without status migrainosus: Secondary | ICD-10-CM | POA: Diagnosis not present

## 2023-06-24 DIAGNOSIS — G509 Disorder of trigeminal nerve, unspecified: Secondary | ICD-10-CM | POA: Diagnosis not present

## 2023-07-03 DIAGNOSIS — R0982 Postnasal drip: Secondary | ICD-10-CM | POA: Diagnosis not present

## 2023-07-03 DIAGNOSIS — H6121 Impacted cerumen, right ear: Secondary | ICD-10-CM | POA: Diagnosis not present

## 2023-09-05 DIAGNOSIS — G500 Trigeminal neuralgia: Secondary | ICD-10-CM | POA: Diagnosis not present

## 2023-09-22 DIAGNOSIS — G500 Trigeminal neuralgia: Secondary | ICD-10-CM | POA: Diagnosis not present

## 2023-09-30 ENCOUNTER — Ambulatory Visit (INDEPENDENT_AMBULATORY_CARE_PROVIDER_SITE_OTHER): Admitting: Family

## 2023-09-30 ENCOUNTER — Other Ambulatory Visit: Payer: Self-pay | Admitting: Family

## 2023-09-30 ENCOUNTER — Encounter: Payer: Self-pay | Admitting: Family

## 2023-09-30 VITALS — BP 132/74 | HR 91 | Temp 100.2°F | Ht 72.0 in | Wt 166.2 lb

## 2023-09-30 DIAGNOSIS — J069 Acute upper respiratory infection, unspecified: Secondary | ICD-10-CM | POA: Diagnosis not present

## 2023-09-30 DIAGNOSIS — U071 COVID-19: Secondary | ICD-10-CM | POA: Diagnosis not present

## 2023-09-30 LAB — POC COVID19 BINAXNOW: SARS Coronavirus 2 Ag: POSITIVE — AB

## 2023-09-30 LAB — POCT RAPID STREP A (OFFICE): Rapid Strep A Screen: NEGATIVE

## 2023-09-30 LAB — POC INFLUENZA A&B (BINAX/QUICKVUE)
Influenza A, POC: NEGATIVE
Influenza B, POC: NEGATIVE

## 2023-09-30 MED ORDER — NIRMATRELVIR/RITONAVIR (PAXLOVID)TABLET: 3.0000 | ORAL_TABLET | Freq: Two times a day (BID) | ORAL | 0 refills | Status: AC

## 2023-09-30 NOTE — Progress Notes (Signed)
 Alan Branch is a 28 y.o. male with the following history as recorded in [employer]:  Patient Active Problem List   Diagnosis Date Noted   Internal hemorrhoids 01/05/2022   Intractable common migraine without aura 10/07/2018   PCP NOTES >>>>>>>>>>>>>>>>>>>>>>>>>>>. 07/11/2015   Abnormal weight loss 10/31/2014   Jaw pain 10/29/2014   Trigeminal neuralgia 05/27/2014   Annual physical exam 05/15/2012    Current Outpatient Medications  Medication Sig Dispense Refill   clonazePAM (KLONOPIN) 1 MG tablet Take 1 mg by mouth at bedtime.     montelukast  (SINGULAIR ) 10 MG tablet Take 1 tablet (10 mg total) by mouth at bedtime. 90 tablet 3   nirmatrelvir/ritonavir (PAXLOVID) 20 x 150 MG & 10 x 100MG  TABS Take 3 tablets by mouth 2 (two) times daily for 5 days. (Take nirmatrelvir 150 mg two tablets twice daily for 5 days and ritonavir 100 mg one tablet twice daily for 5 days) Patient GFR is 119 30 tablet 0   Oxcarbazepine (TRILEPTAL) 300 MG tablet Take 1,200 mg by mouth daily.     No current facility-administered medications for this visit.    Allergies: Patient has no known allergies.  Past Medical History:  Diagnosis Date   Trigeminal neuralgia 2016   Unintended weight loss 2016    Past Surgical History:  Procedure Laterality Date   ADENOIDECTOMY  around 2011   WISDOM TOOTH EXTRACTION  2016    Family History  Problem Relation Age of Onset   Diabetes Other        only GPs   CAD Other        only GGPs   Colon cancer Neg Hx    Prostate cancer Neg Hx     Social History   Tobacco Use   Smoking status: Never   Smokeless tobacco: Never  Substance Use Topics   Alcohol use: No    Subjective:   Started yesterday with cough/ congestion; temperature today is at 100.2/ ran fever of 101 last night; concerned he may have a sinus infection; no chest pain, shortness of breath;   Objective:  Vitals:   09/30/23 0831  BP: 132/74  Pulse: 91  Temp: 100.2 F (37.9 C)  TempSrc: Oral  SpO2:  97%  Weight: 166 lb 3.2 oz (75.4 kg)  Height: 6' (1.829 m)    General: Well developed, well nourished, in no acute distress  Skin : Warm and dry.  Head: Normocephalic and atraumatic  Eyes: Sclera and conjunctiva clear; pupils round and reactive to light; extraocular movements intact  Ears: External normal; canals clear; tympanic membranes normal  Oropharynx: Pink, supple. No suspicious lesions  Neck: Supple without thyromegaly, adenopathy  Lungs: Respirations unlabored; clear to auscultation bilaterally without wheeze, rales, rhonchi  CVS exam: normal rate and regular rhythm.  Neurologic: Alert and oriented; speech intact; face symmetrical; moves all extremities well; CNII-XII intact without focal deficit   Assessment:  1. COVID-19   2. Viral URI     Plan:  COVID 19 is positive; due to medical history of trigeminal neuralgia and recent current pain flare, will go ahead and treat with Paxlovid; symptomatic treatment discussed/ increase fluids, rest; work note given as requested; okay to return to work on Friday assuming fever free for 24 hours and his company policies in agreement; follow up worse, no better.   No follow-ups on file.  Orders Placed This Encounter  Procedures   POC COVID-19    Previously tested for COVID-19:   No  Resident in a congregate (group) care setting:   No    Employed in healthcare setting:   No   POC Influenza A&B (Binax test)   POCT rapid strep A    Requested Prescriptions   Signed Prescriptions Disp Refills   nirmatrelvir/ritonavir (PAXLOVID) 20 x 150 MG & 10 x 100MG  TABS 30 tablet 0    Sig: Take 3 tablets by mouth 2 (two) times daily for 5 days. (Take nirmatrelvir 150 mg two tablets twice daily for 5 days and ritonavir 100 mg one tablet twice daily for 5 days) Patient GFR is 119

## 2023-10-02 ENCOUNTER — Other Ambulatory Visit: Payer: Self-pay | Admitting: Family

## 2023-10-02 ENCOUNTER — Encounter: Payer: Self-pay | Admitting: Internal Medicine

## 2023-10-02 DIAGNOSIS — J40 Bronchitis, not specified as acute or chronic: Secondary | ICD-10-CM

## 2023-10-02 MED ORDER — PREDNISONE 20 MG PO TABS: 40.0000 mg | ORAL_TABLET | Freq: Every day | ORAL | 0 refills | Status: AC

## 2023-10-02 MED ORDER — AZITHROMYCIN 250 MG PO TABS: ORAL_TABLET | ORAL | 0 refills | Status: DC

## 2023-12-15 ENCOUNTER — Ambulatory Visit: Payer: Self-pay

## 2023-12-15 NOTE — Telephone Encounter (Signed)
 FYI Only or Action Required?: FYI only for provider.  Patient was last seen in primary care on 09/30/2023 by Jason Leita Repine, FNP.  Called Nurse Triage reporting Dizziness and vision changes - see triage notes.  Symptoms began several months ago.  Interventions attempted: Nothing.  Symptoms are: unchanged.  Triage Disposition: See PCP When Office is Open (Within 3 Days)  Patient/caregiver understands and will follow disposition?: Yes     Copied from CRM [id number]. Topic: Clinical - Red Word Triage >> Dec 15, 2023  7:52 AM Frederich PARAS wrote: [facility] that prompted transfer to Nurse Triage: dizziness PT Calling in b/c he has trouble with vision, everything is moving up . pt is experiencing dizzines, feels like he is going to fall out if he stand due to being off balanced ,pt says it will last between and hr and a half Answer Assessment - Initial Assessment Questions Patient has trigeminal neuralgia. Patient states 'it happens 3 or 4 times a month. It doesn't happen everyday. I wonder if my blood sugar is high or low, I am not a diabetic  1. DESCRIPTION: How has your vision changed? (e.g., complete vision loss, blurred vision, double vision, floaters, etc.)     Patient states 'my vision is messing up, it feels like my vision is scrolling up' 2. LOCATION: One or both eyes? If one, ask: Which eye?     Both eyes - he states if he covers one eye then the other one is fine and he does not experience this. He only experiences it when both eyes are open and trying to focus 3. SEVERITY: Can you see anything? If Yes, ask: What can you see? (e.g., fine print)     Patient states when these episodes happen, he has to sit down  4. ONSET: When did this begin? Did it start suddenly or has this been gradual?     Patient states when the episodes last, they last roughly an hour. He states he has to close his eyes for 30 minutes for it to go away. Occurred for the first time 3 months  ago. 5. PATTERN: Does this come and go, or has it been constant since it started?     Comes and goes - patient states it doesn't happen every day. Patient states it happens 3 to 4 times a month at month.  6. PAIN: Is there any pain in your eye(s)?  (Scale 1-10; or mild, moderate, severe)     No 7. CONTACTS-GLASSES: Do you wear contacts or glasses?     No 8. CAUSE: What do you think is causing this visual problem?     Patient is uncertain what is causing it  9. OTHER SYMPTOMS: Do you have any other symptoms? (e.g., confusion, headache, arm or leg weakness, speech problems)     Dizziness/wobbling feeling   Patient denies speech problems,  Protocols used: Vision Loss or Change-A-AH  Reason for Disposition  [1] Brief (now gone) blurred vision AND [2] unexplained  Answer Assessment - Initial Assessment Questions Patient has trigeminal neuralgia. Patient states 'it happens 3 or 4 times a month. It doesn't happen everyday. I wonder if my blood sugar is high or low, I am not a diabetic  1. DESCRIPTION: How has your vision changed? (e.g., complete vision loss, blurred vision, double vision, floaters, etc.)     Patient states 'my vision is messing up, it feels like my vision is scrolling up' 2. LOCATION: One or both eyes? If one, ask:  Which eye?     Both eyes - he states if he covers one eye then the other one is fine and he does not experience this. He only experiences it when both eyes are open and trying to focus 3. SEVERITY: Can you see anything? If Yes, ask: What can you see? (e.g., fine print)     Patient states when these episodes happen, he has to sit down  4. ONSET: When did this begin? Did it start suddenly or has this been gradual?     Patient states when the episodes last, they last roughly an hour. He states he has to close his eyes for 30 minutes for it to go away. Occurred for the first time 3 months ago. 5. PATTERN: Does this come and go, or has it been  constant since it started?     Comes and goes - patient states it doesn't happen every day. Patient states it happens 3 to 4 times a month at month.  6. PAIN: Is there any pain in your eye(s)?  (Scale 1-10; or mild, moderate, severe)     No 7. CONTACTS-GLASSES: Do you wear contacts or glasses?     No 8. CAUSE: What do you think is causing this visual problem?     Patient is uncertain what is causing it  9. OTHER SYMPTOMS: Do you have any other symptoms? (e.g., confusion, headache, arm or leg weakness, speech problems)     Dizziness/wobbling feeling   Patient denies speech problems,  Protocols used: Vision Loss or Change-A-AH  Reason for Disposition  [1] Brief (now gone) blurred vision AND [2] unexplained  [1] Headache AND [2] age > 50 years  Answer Assessment - Initial Assessment Questions Patient has trigeminal neuralgia. Patient states 'it happens 3 or 4 times a month. It doesn't happen everyday. I wonder if my blood sugar is high or low, I am not a diabetic  1. DESCRIPTION: How has your vision changed? (e.g., complete vision loss, blurred vision, double vision, floaters, etc.)     Patient states 'my vision is messing up, it feels like my vision is scrolling up' 2. LOCATION: One or both eyes? If one, ask: Which eye?     Both eyes - he states if he covers one eye then the other one is fine and he does not experience this. He only experiences it when both eyes are open and trying to focus 3. SEVERITY: Can you see anything? If Yes, ask: What can you see? (e.g., fine print)     Patient states when these episodes happen, he has to sit down  4. ONSET: When did this begin? Did it start suddenly or has this been gradual?     Patient states when the episodes last, they last roughly an hour. He states he has to close his eyes for 30 minutes for it to go away. Occurred for the first time 3 months ago. 5. PATTERN: Does this come and go, or has it been constant since it  started?     Comes and goes - patient states it doesn't happen every day. Patient states it happens 3 to 4 times a month at month.  6. PAIN: Is there any pain in your eye(s)?  (Scale 1-10; or mild, moderate, severe)     No 7. CONTACTS-GLASSES: Do you wear contacts or glasses?     No 8. CAUSE: What do you think is causing this visual problem?     Patient is uncertain what is causing  it  9. OTHER SYMPTOMS: Do you have any other symptoms? (e.g., confusion, headache, arm or leg weakness, speech problems)     Dizziness/wobbling feeling   Patient denies speech problems,  Protocols used: Vision Loss or Change-A-AH  Reason for Disposition  [1] Brief (now gone) blurred vision AND [2] unexplained  Answer Assessment - Initial Assessment Questions Patient has trigeminal neuralgia. Patient states 'it happens 3 or 4 times a month. It doesn't happen everyday. I wonder if my blood sugar is high or low, I am not a diabetic  1. DESCRIPTION: How has your vision changed? (e.g., complete vision loss, blurred vision, double vision, floaters, etc.)     Patient states 'my vision is messing up, it feels like my vision is scrolling up' 2. LOCATION: One or both eyes? If one, ask: Which eye?     Both eyes - he states if he covers one eye then the other one is fine and he does not experience this. He only experiences it when both eyes are open and trying to focus 3. SEVERITY: Can you see anything? If Yes, ask: What can you see? (e.g., fine print)     Patient states when these episodes happen, he has to sit down  4. ONSET: When did this begin? Did it start suddenly or has this been gradual?     Patient states when the episodes last, they last roughly an hour. He states he has to close his eyes for 30 minutes for it to go away. Occurred for the first time 3 months ago. 5. PATTERN: Does this come and go, or has it been constant since it started?     Comes and goes - patient states it doesn't  happen every day. Patient states it happens 3 to 4 times a month at month.  6. PAIN: Is there any pain in your eye(s)?  (Scale 1-10; or mild, moderate, severe)     No 7. CONTACTS-GLASSES: Do you wear contacts or glasses?     No 8. CAUSE: What do you think is causing this visual problem?     Patient is uncertain what is causing it  9. OTHER SYMPTOMS: Do you have any other symptoms? (e.g., confusion, headache, arm or leg weakness, speech problems)     Dizziness/wobbling feeling   Patient denies speech problems,  Protocols used: Vision Loss or Change-A-AH

## 2023-12-16 ENCOUNTER — Ambulatory Visit: Payer: Self-pay | Admitting: Internal Medicine

## 2023-12-16 ENCOUNTER — Ambulatory Visit (INDEPENDENT_AMBULATORY_CARE_PROVIDER_SITE_OTHER): Admitting: Internal Medicine

## 2023-12-16 ENCOUNTER — Encounter: Payer: Self-pay | Admitting: Internal Medicine

## 2023-12-16 VITALS — BP 126/80 | HR 69 | Temp 98.1°F | Resp 16 | Ht 72.0 in | Wt 174.0 lb

## 2023-12-16 DIAGNOSIS — R42 Dizziness and giddiness: Secondary | ICD-10-CM | POA: Diagnosis not present

## 2023-12-16 DIAGNOSIS — H539 Unspecified visual disturbance: Secondary | ICD-10-CM

## 2023-12-16 LAB — CBC WITH DIFFERENTIAL/PLATELET
Basophils Absolute: 0 K/uL (ref 0.0–0.1)
Basophils Relative: 0.4 % (ref 0.0–3.0)
Eosinophils Absolute: 0.1 K/uL (ref 0.0–0.7)
Eosinophils Relative: 1.1 % (ref 0.0–5.0)
HCT: 45.6 % (ref 39.0–52.0)
Hemoglobin: 15.4 g/dL (ref 13.0–17.0)
Lymphocytes Relative: 36.2 % (ref 12.0–46.0)
Lymphs Abs: 2.1 K/uL (ref 0.7–4.0)
MCHC: 33.6 g/dL (ref 30.0–36.0)
MCV: 92 fl (ref 78.0–100.0)
Monocytes Absolute: 0.5 K/uL (ref 0.1–1.0)
Monocytes Relative: 8 % (ref 3.0–12.0)
Neutro Abs: 3.1 K/uL (ref 1.4–7.7)
Neutrophils Relative %: 54.3 % (ref 43.0–77.0)
Platelets: 267 K/uL (ref 150.0–400.0)
RBC: 4.96 Mil/uL (ref 4.22–5.81)
RDW: 14 % (ref 11.5–15.5)
WBC: 5.7 K/uL (ref 4.0–10.5)

## 2023-12-16 LAB — SEDIMENTATION RATE: Sed Rate: 1 mm/h (ref 0–15)

## 2023-12-16 NOTE — Assessment & Plan Note (Signed)
 Dizziness, visual disturbances: Having the spells as described above, on clinical grounds unlikely to be a seizure or migraine. Patient wonders if he is hypoglycemia related, unlikely but for that reason recommend to check his sugars when symptomatic.  He has access to a glucometer. He already saw ophthalmologist, no etiology found. Brain MRI 09/22/2023: No acute. Will see neurology next month, recommend to keep the appointment and call them sooner if symptoms increase. For completeness we will check a CBC and sed rate. Trigeminal neuralgia:  Saw neurosurgery, he seems to have some surgical options. Fortunately, symptoms have been controlled for the last 3 months. Has an appointment with neurology next month

## 2023-12-16 NOTE — Patient Instructions (Signed)
 Get your blood work  See your neurologist next month as you are planning.  If you have another episode: If possible check your sugar right there and then.  If it is very low that may account for your symptoms.  If your symptoms change or get worse: Reach out to neurology.

## 2023-12-16 NOTE — Progress Notes (Signed)
   Subjective:    Patient ID: Alan Branch, male    DOB: [birth year], 28 y.o.   MRN: [id number]  DOS:  12/16/2023 Type of visit - description: Acute  For the last 3 months is having episodes every 2 to 3 weeks: Feels suddenly dizzy, has to sit down, visual disturbances like things are moving upwards. He did try to drink some sugary drinks but that did not help. Reported his spells last about 1 hour.  No associated seizure activity or postictal state.  No bladder or bowel incontinence. No confusion No headache or palpitations. Wonders if this could be hypoglycemia  Review of Systems See above   Past Medical History:  Diagnosis Date   Trigeminal neuralgia 2016   Unintended weight loss 2016    Past Surgical History:  Procedure Laterality Date   ADENOIDECTOMY  around 2011   WISDOM TOOTH EXTRACTION  2016    Current Outpatient Medications  Medication Instructions   clonazePAM (KLONOPIN) 1 mg, Daily at bedtime   montelukast  (SINGULAIR ) 10 mg, Oral, Daily at bedtime   Oxcarbazepine (TRILEPTAL) 1,200 mg, Daily       Objective:   Physical Exam BP 126/80   Pulse 69   Temp 98.1 F (36.7 C) (Oral)   Resp 16   Ht 6' (1.829 m)   Wt 174 lb (78.9 kg)   SpO2 97%   BMI 23.60 kg/m  General:   Well developed, NAD, BMI noted. HEENT:  Normocephalic . Face symmetric, atraumatic Temples: No TTP. EOMI. Lungs:  CTA B Normal respiratory effort, no intercostal retractions, no accessory muscle use. Heart: RRR,  no murmur.  Lower extremities: no pretibial edema bilaterally  Skin: Not pale. Not jaundice Neurologic:  alert & oriented X3.  Speech normal, gait appropriate for age and unassisted.  Motor symmetric. Psych--  Cognition and judgment appear intact.  Cooperative with normal attention span and concentration.  Behavior appropriate. No anxious or depressed appearing.      Assessment   ASSESSMENT Trigeminal neuralgia after a dental procedure, chronic jaw pain, per  [facility]    PLAN Dizziness, visual disturbances: Having the spells as described above, on clinical grounds unlikely to be a seizure or migraine. Patient wonders if he is hypoglycemia related, unlikely but for that reason recommend to check his sugars when symptomatic.  He has access to a glucometer. He already saw ophthalmologist, no etiology found. Brain MRI 09/22/2023: No acute. Will see neurology next month, recommend to keep the appointment and call them sooner if symptoms increase. For completeness we will check a CBC and sed rate. Trigeminal neuralgia:  Saw neurosurgery, he seems to have some surgical options. Fortunately, symptoms have been controlled for the last 3 months. Has an appointment with neurology next month

## 2024-01-05 ENCOUNTER — Ambulatory Visit (INDEPENDENT_AMBULATORY_CARE_PROVIDER_SITE_OTHER): Payer: PRIVATE HEALTH INSURANCE | Admitting: Internal Medicine

## 2024-01-05 ENCOUNTER — Encounter: Payer: Self-pay | Admitting: Internal Medicine

## 2024-01-05 VITALS — BP 118/80 | HR 86 | Temp 98.3°F | Resp 12 | Ht 72.0 in | Wt 168.2 lb

## 2024-01-05 DIAGNOSIS — Z0000 Encounter for general adult medical examination without abnormal findings: Secondary | ICD-10-CM | POA: Diagnosis not present

## 2024-01-05 LAB — HEPATIC FUNCTION PANEL
ALT: 14 U/L (ref 0–53)
AST: 14 U/L (ref 0–37)
Albumin: 5.1 g/dL (ref 3.5–5.2)
Alkaline Phosphatase: 61 U/L (ref 39–117)
Bilirubin, Direct: 0.1 mg/dL (ref 0.0–0.3)
Total Bilirubin: 0.3 mg/dL (ref 0.2–1.2)
Total Protein: 7.1 g/dL (ref 6.0–8.3)

## 2024-01-05 LAB — TSH: TSH: 1.46 u[IU]/mL (ref 0.35–5.50)

## 2024-01-05 NOTE — Patient Instructions (Signed)
   GO TO THE LAB :  Get the blood work   Your results will be posted on MyChart with my comments  Next office visit for a physical exam in 1 year. Please make an appointment before you leave today

## 2024-01-05 NOTE — Assessment & Plan Note (Signed)
 Here for CPX  Other issues: Dizziness, visual disturbances: See LOV, symptoms less intense-frequent but is still there.  Has not seen neurology yet. Trigeminal neuralgia: Currently stable RTC 1 year CPX

## 2024-01-05 NOTE — Progress Notes (Signed)
   Subjective:    Patient ID: Alan Branch, male    DOB: 24-Jun-1995, 28 y.o.   MRN: [id number]  DOS:  01/05/2024 Type of visit - description: CPX  Here for CPX See LOV, symptoms slightly better but still present. No fever chills. No syncope.   Review of Systems  Other than above, a 14 point review of systems is negative    Past Medical History:  Diagnosis Date   Trigeminal neuralgia 2016   Unintended weight loss 2016    Past Surgical History:  Procedure Laterality Date   ADENOIDECTOMY  around 2011   WISDOM TOOTH EXTRACTION  2016   Social History   Socioeconomic History   Marital status: Single    Spouse name: Not on file   Number of children: 0   Years of education: Not on file   Highest education level: Not on file  Occupational History   Occupation: distribution center  Tobacco Use   Smoking status: Never   Smokeless tobacco: Never  Substance and Sexual Activity   Alcohol use: No   Drug use: No   Sexual activity: Not on file  Other Topics Concern   Not on file  Social History Narrative   Household: Father , step mom    Social Drivers of Corporate investment banker Strain: Not on file  Food Insecurity: Not on file  Transportation Needs: Not on file  Physical Activity: Not on file  Stress: Not on file  Social Connections: Not on file  Intimate Partner Violence: Not on file     Current Outpatient Medications  Medication Instructions   clonazePAM (KLONOPIN) 1 mg, Daily at bedtime   montelukast  (SINGULAIR ) 10 mg, Oral, Daily at bedtime   Oxcarbazepine (TRILEPTAL) 1,200 mg, Daily       Objective:   Physical Exam BP 118/80 (BP Location: Left Arm, Patient Position: Sitting, Cuff Size: Normal)   Pulse 86   Temp 98.3 F (36.8 C) (Oral)   Resp 12   Ht 6' (1.829 m)   Wt 168 lb 3.2 oz (76.3 kg)   SpO2 99%   BMI 22.81 kg/m  General: Well developed, NAD, BMI noted Neck: No  thyromegaly  HEENT:  Normocephalic . Face symmetric,  atraumatic Lungs:  CTA B Normal respiratory effort, no intercostal retractions, no accessory muscle use. Heart: RRR,  no murmur.  Abdomen:  Not distended, soft, non-tender. No rebound or rigidity.   Lower extremities: no pretibial edema bilaterally  Skin: Exposed areas without rash. Not pale. Not jaundice Neurologic:  alert & oriented X3.  Speech normal, gait appropriate for age and unassisted Strength symmetric and appropriate for age.  Psych: Cognition and judgment appear intact.  Cooperative with normal attention span and concentration.  Behavior appropriate. No anxious or depressed appearing.     Assessment   ASSESSMENT Trigeminal neuralgia after a dental procedure, chronic jaw pain, per [facility]    PLAN Here for CPX Tdap 2018 Menactra, Bexero, HPV: Completed Hepatitis A-- declined before Rec flu and COVID-vaccine.  Declines. Labs: LFTs, TSH. Patient education: Safe sex, self testicular exam  Other issues: Dizziness, visual disturbances: See LOV, symptoms less intense-frequent but is still there.  Has not seen neurology yet. Trigeminal neuralgia: Currently stable RTC 1 year CPX

## 2024-01-05 NOTE — Assessment & Plan Note (Signed)
 Here for CPX Tdap 2018 Menactra, Bexero, HPV: Completed Hepatitis A-- declined before Rec flu and COVID-vaccine.  Declines. Labs: LFTs, TSH. Patient education: Safe sex, self testicular exam

## 2024-01-07 ENCOUNTER — Ambulatory Visit: Payer: Self-pay | Admitting: Internal Medicine

## 2024-02-09 DIAGNOSIS — R519 Headache, unspecified: Secondary | ICD-10-CM | POA: Diagnosis not present

## 2024-02-09 DIAGNOSIS — G509 Disorder of trigeminal nerve, unspecified: Secondary | ICD-10-CM | POA: Diagnosis not present

## 2024-02-09 DIAGNOSIS — G500 Trigeminal neuralgia: Secondary | ICD-10-CM | POA: Diagnosis not present

## 2024-03-02 ENCOUNTER — Telehealth: Payer: Self-pay

## 2024-03-02 ENCOUNTER — Encounter: Payer: Self-pay | Admitting: Internal Medicine

## 2024-03-02 ENCOUNTER — Ambulatory Visit (INDEPENDENT_AMBULATORY_CARE_PROVIDER_SITE_OTHER): Admitting: Internal Medicine

## 2024-03-02 VITALS — BP 122/60 | HR 70 | Temp 98.6°F | Resp 16 | Ht 72.0 in | Wt 164.0 lb

## 2024-03-02 DIAGNOSIS — K648 Other hemorrhoids: Secondary | ICD-10-CM | POA: Diagnosis not present

## 2024-03-02 DIAGNOSIS — K649 Unspecified hemorrhoids: Secondary | ICD-10-CM

## 2024-03-02 MED ORDER — LIDOCAINE 5 % EX OINT: 1.0000 | TOPICAL_OINTMENT | CUTANEOUS | 0 refills | Status: AC | PRN

## 2024-03-02 MED ORDER — HYDROCORTISONE ACETATE 25 MG RE SUPP: 25.0000 mg | Freq: Two times a day (BID) | RECTAL | 1 refills | Status: AC | PRN

## 2024-03-02 NOTE — Assessment & Plan Note (Signed)
 Hemorrhoids, internal Bleeding and irritation from internal hemorrhoids, first episode in 2023 and this is the second time. He wonders about a GI referral and that will be necessary if he has severe or recurrent symptoms.  For this particular episode I recommend the following: - Anusol  HC suppositories as needed. - Advise high-fiber diet or use of Metamucil - Colace if stools are not soft.  - Suggest lidocaine  topical for symptomatic relief if needed. Patient verbalized understanding

## 2024-03-02 NOTE — Progress Notes (Signed)
   Subjective:    Patient ID: Alan Branch, male    DOB: 02-Dec-1995, 28 y.o.   MRN: [id number]  DOS:  03/02/2024 Acute Discussed the use of AI scribe software for clinical note transcription with the patient, who gave verbal consent to proceed.  History of Present Illness Rectal bleeding - Rectal bleeding present for two weeks - Second episode, with first episode occurring one year ago - Blood appears as a significant amount of red blood on top of brown stool and in the toilet water - Symptoms worsened yesterday - Over-the-counter treatments provided some relief  Anorectal irritation and sensation - Sensation of a scab or irritation when wiping - Slight pruritus, but no significant pain or itching compared to previous episode - Sensation of something being stuck in the rectum  Bowel habits - Stools usually soft - No straining during bowel movements  Associated symptoms - No fever, chills, nausea, or abdominal pain     Review of Systems See above   Past Medical History:  Diagnosis Date   Trigeminal neuralgia 2016   Unintended weight loss 2016    Past Surgical History:  Procedure Laterality Date   ADENOIDECTOMY  around 2011   WISDOM TOOTH EXTRACTION  2016    Current Outpatient Medications  Medication Instructions   clonazePAM (KLONOPIN) 1 mg, Daily at bedtime   montelukast  (SINGULAIR ) 10 mg, Oral, Daily at bedtime   Oxcarbazepine (TRILEPTAL) 1,200 mg, Daily       Objective:   Physical Exam BP 122/60   Pulse 70   Temp 98.6 F (37 C) (Oral)   Resp 16   Ht 6' (1.829 m)   Wt 164 lb (74.4 kg)   SpO2 97%   BMI 22.24 kg/m  General:   Well developed, NAD, BMI noted. HEENT:  Normocephalic . Face symmetric, atraumatic Abdomen soft, nontender Anoscopy: With the patient consent I introduced will recoapted self lighted anoscope.  I found the hemorrhoids mostly at 6:00 with no current bleeding. DRE: Normal external examination, normal sphincter tone, no  stools.  Normal prostate. Lower extremities: no pretibial edema bilaterally  Skin: Not pale. Not jaundice Neurologic:  alert & oriented X3.  Speech normal, gait appropriate for age and unassisted Psych--  Cognition and judgment appear intact.  Cooperative with normal attention span and concentration.  Behavior appropriate. No anxious or depressed appearing.      Assessment     ASSESSMENT Trigeminal neuralgia after a dental procedure, chronic jaw pain, per [facility] Internal hemorrhoids  Assessment & Plan Hemorrhoids, internal Bleeding and irritation from internal hemorrhoids, first episode in 2023 and this is the second time. He wonders about a GI referral and that will be necessary if he has severe or recurrent symptoms.  For this particular episode I recommend the following: - Anusol  HC suppositories as needed. - Advise high-fiber diet or use of Metamucil - Colace if stools are not soft.  - Suggest lidocaine  topical for symptomatic relief if needed. Patient verbalized understanding

## 2024-03-02 NOTE — Telephone Encounter (Signed)
 Needs PA for lidocaine  ointment please.

## 2024-03-02 NOTE — Patient Instructions (Signed)
  HEMORRHOIDS: You have recurrent hemorrhoids causing rectal bleeding and irritation.   -Use Anusol  HC suppositories as needed for relief.  - Follow-up high-fiber diet with fruits and vegetables or take Metamucil capsules daily.  - If at some point your stools are not soft you can use Colace over-the-counter, stool softener   -Apply lidocaine  topical if you need additional symptomatic relief.  -If your symptoms become severe or keep coming back, we may need to refer you to a gastrointestinal specialist.

## 2024-03-03 ENCOUNTER — Other Ambulatory Visit (HOSPITAL_COMMUNITY): Payer: Self-pay

## 2024-03-03 ENCOUNTER — Telehealth: Payer: Self-pay

## 2024-03-03 NOTE — Telephone Encounter (Signed)
 Clinical questions answered and PA submitted.

## 2024-03-03 NOTE — Telephone Encounter (Signed)
 Pharmacy Patient Advocate Encounter   Received notification from Pt Calls Messages that prior authorization for Lidocaine  5% ointment is required/requested.   Insurance verification completed.   The patient is insured through [employer].   Per test claim: PA required; PA started via CoverMyMeds. KEY [id number] . Waiting for clinical questions to populate.

## 2024-03-08 NOTE — Telephone Encounter (Signed)
 Pharmacy Patient Advocate Encounter  Received notification from [facility] that Prior Authorization for Lidocaine  patches has been APPROVED from 03/03/2024 to 03/03/2025   PA #/Case ID/Reference #: [id number]  *Clinic notified in separate message

## 2024-03-08 NOTE — Telephone Encounter (Signed)
PA has been approved, thank you!

## 2025-01-05 ENCOUNTER — Encounter: Admitting: Internal Medicine
